# Patient Record
Sex: Female | Born: 1960 | Race: White | Hispanic: No | Marital: Married | Smoking: Former smoker
Health system: Southern US, Community
[De-identification: ages and names within clinical notes are randomized; demographics above are authoritative.]

## PROBLEM LIST (undated history)

## (undated) DIAGNOSIS — R519 Headache, unspecified: Secondary | ICD-10-CM

## (undated) DIAGNOSIS — K279 Peptic ulcer, site unspecified, unspecified as acute or chronic, without hemorrhage or perforation: Secondary | ICD-10-CM

## (undated) DIAGNOSIS — K219 Gastro-esophageal reflux disease without esophagitis: Secondary | ICD-10-CM

## (undated) DIAGNOSIS — S0990XA Unspecified injury of head, initial encounter: Secondary | ICD-10-CM

## (undated) DIAGNOSIS — I10 Essential (primary) hypertension: Secondary | ICD-10-CM

## (undated) DIAGNOSIS — N921 Excessive and frequent menstruation with irregular cycle: Secondary | ICD-10-CM

## (undated) DIAGNOSIS — K5900 Constipation, unspecified: Secondary | ICD-10-CM

## (undated) DIAGNOSIS — Z87891 Personal history of nicotine dependence: Secondary | ICD-10-CM

## (undated) DIAGNOSIS — T1490XA Injury, unspecified, initial encounter: Secondary | ICD-10-CM

## (undated) DIAGNOSIS — D649 Anemia, unspecified: Secondary | ICD-10-CM

## (undated) DIAGNOSIS — M1990 Unspecified osteoarthritis, unspecified site: Secondary | ICD-10-CM

## (undated) HISTORY — DX: Essential (primary) hypertension: I10

## (undated) HISTORY — DX: Constipation, unspecified: K59.00

## (undated) HISTORY — PX: NECK SURGERY: SHX720

## (undated) HISTORY — DX: Unspecified injury of head, initial encounter: S09.90XA

## (undated) HISTORY — DX: Peptic ulcer, site unspecified, unspecified as acute or chronic, without hemorrhage or perforation: K27.9

## (undated) HISTORY — DX: Personal history of nicotine dependence: Z87.891

## (undated) HISTORY — PX: BRAIN SURGERY: SHX531

## (undated) HISTORY — DX: Anemia, unspecified: D64.9

## (undated) HISTORY — DX: Gastro-esophageal reflux disease without esophagitis: K21.9

## (undated) HISTORY — DX: Injury, unspecified, initial encounter: T14.90XA

## (undated) HISTORY — DX: Excessive and frequent menstruation with irregular cycle: N92.1

---

## 1994-03-10 DIAGNOSIS — S0990XA Unspecified injury of head, initial encounter: Secondary | ICD-10-CM

## 1994-03-10 HISTORY — PX: OTHER SURGICAL HISTORY: SHX169

## 1994-03-10 HISTORY — DX: Unspecified injury of head, initial encounter: S09.90XA

## 2003-03-21 ENCOUNTER — Emergency Department (HOSPITAL_COMMUNITY): Admission: AD | Admit: 2003-03-21 | Discharge: 2003-03-21 | Payer: Self-pay | Admitting: Emergency Medicine

## 2003-05-15 ENCOUNTER — Encounter: Admission: RE | Admit: 2003-05-15 | Discharge: 2003-07-07 | Payer: Self-pay | Admitting: Sports Medicine

## 2003-06-24 ENCOUNTER — Encounter: Admission: RE | Admit: 2003-06-24 | Discharge: 2003-06-24 | Payer: Self-pay | Admitting: Sports Medicine

## 2004-10-30 ENCOUNTER — Ambulatory Visit: Payer: Self-pay | Admitting: Internal Medicine

## 2004-11-13 ENCOUNTER — Ambulatory Visit: Payer: Self-pay | Admitting: Internal Medicine

## 2004-11-13 LAB — CONVERTED CEMR LAB: Pap Smear: NORMAL

## 2005-12-17 ENCOUNTER — Emergency Department (HOSPITAL_COMMUNITY): Admission: EM | Admit: 2005-12-17 | Discharge: 2005-12-17 | Payer: Self-pay | Admitting: Emergency Medicine

## 2006-01-23 ENCOUNTER — Encounter (INDEPENDENT_AMBULATORY_CARE_PROVIDER_SITE_OTHER): Payer: Self-pay | Admitting: Internal Medicine

## 2006-01-23 DIAGNOSIS — Z87828 Personal history of other (healed) physical injury and trauma: Secondary | ICD-10-CM | POA: Insufficient documentation

## 2006-01-23 DIAGNOSIS — Z87891 Personal history of nicotine dependence: Secondary | ICD-10-CM

## 2006-01-23 DIAGNOSIS — K219 Gastro-esophageal reflux disease without esophagitis: Secondary | ICD-10-CM | POA: Insufficient documentation

## 2006-01-23 DIAGNOSIS — N921 Excessive and frequent menstruation with irregular cycle: Secondary | ICD-10-CM | POA: Insufficient documentation

## 2006-01-23 DIAGNOSIS — N920 Excessive and frequent menstruation with regular cycle: Secondary | ICD-10-CM

## 2006-02-06 ENCOUNTER — Inpatient Hospital Stay (HOSPITAL_COMMUNITY): Admission: RE | Admit: 2006-02-06 | Discharge: 2006-02-07 | Payer: Self-pay | Admitting: Neurosurgery

## 2006-02-20 DIAGNOSIS — D509 Iron deficiency anemia, unspecified: Secondary | ICD-10-CM | POA: Insufficient documentation

## 2009-02-07 ENCOUNTER — Ambulatory Visit (HOSPITAL_COMMUNITY): Admission: RE | Admit: 2009-02-07 | Discharge: 2009-02-07 | Payer: Self-pay | Admitting: Neurosurgery

## 2009-03-13 ENCOUNTER — Ambulatory Visit (HOSPITAL_COMMUNITY): Admission: RE | Admit: 2009-03-13 | Discharge: 2009-03-14 | Payer: Self-pay | Admitting: Neurosurgery

## 2009-03-22 ENCOUNTER — Emergency Department (HOSPITAL_COMMUNITY): Admission: EM | Admit: 2009-03-22 | Discharge: 2009-03-22 | Payer: Self-pay | Admitting: Emergency Medicine

## 2009-04-12 ENCOUNTER — Encounter (HOSPITAL_COMMUNITY): Admission: RE | Admit: 2009-04-12 | Discharge: 2009-05-12 | Payer: Self-pay | Admitting: Neurosurgery

## 2009-05-14 ENCOUNTER — Encounter (HOSPITAL_COMMUNITY): Admission: RE | Admit: 2009-05-14 | Discharge: 2009-06-13 | Payer: Self-pay | Admitting: Neurosurgery

## 2010-01-22 ENCOUNTER — Encounter (HOSPITAL_COMMUNITY)
Admission: RE | Admit: 2010-01-22 | Discharge: 2010-02-21 | Payer: Self-pay | Source: Home / Self Care | Attending: Neurosurgery | Admitting: Neurosurgery

## 2010-02-27 ENCOUNTER — Encounter (HOSPITAL_COMMUNITY)
Admission: RE | Admit: 2010-02-27 | Discharge: 2010-03-29 | Payer: Self-pay | Source: Home / Self Care | Attending: Neurosurgery | Admitting: Neurosurgery

## 2010-03-31 ENCOUNTER — Encounter: Payer: Self-pay | Admitting: General Surgery

## 2010-05-24 ENCOUNTER — Emergency Department (HOSPITAL_COMMUNITY)
Admission: EM | Admit: 2010-05-24 | Discharge: 2010-05-25 | Discharge status: Against Medical Advice | Payer: PRIVATE HEALTH INSURANCE | Attending: Emergency Medicine | Admitting: Emergency Medicine

## 2010-05-24 ENCOUNTER — Emergency Department (HOSPITAL_COMMUNITY): Payer: PRIVATE HEALTH INSURANCE

## 2010-05-24 DIAGNOSIS — R079 Chest pain, unspecified: Secondary | ICD-10-CM | POA: Insufficient documentation

## 2010-05-24 DIAGNOSIS — R55 Syncope and collapse: Secondary | ICD-10-CM | POA: Insufficient documentation

## 2010-05-24 DIAGNOSIS — F411 Generalized anxiety disorder: Secondary | ICD-10-CM | POA: Insufficient documentation

## 2010-05-24 DIAGNOSIS — I10 Essential (primary) hypertension: Secondary | ICD-10-CM | POA: Insufficient documentation

## 2010-05-24 LAB — BASIC METABOLIC PANEL
BUN: 7 mg/dL (ref 6–23)
CO2: 26 mEq/L (ref 19–32)
Calcium: 9.7 mg/dL (ref 8.4–10.5)
Chloride: 104 mEq/L (ref 96–112)
GFR calc Af Amer: >60 mL/min (ref >60)
Glucose, Bld: 89 mg/dL (ref 70–99)
Potassium: 3.4 mEq/L — ABNORMAL LOW (ref 3.5–5.1)
Sodium: 140 mEq/L (ref 135–145)

## 2010-05-24 LAB — CBC
HCT: 38 % (ref 36.0–46.0)
Hemoglobin: 12.7 g/dL (ref 12.0–15.0)
MCH: 30.2 pg (ref 26.0–34.0)
MCV: 90.3 fL (ref 78.0–100.0)
Platelets: 175 10*3/uL (ref 150–400)
RBC: 4.21 MIL/uL (ref 3.87–5.11)
RDW: 12.4 % (ref 11.5–15.5)
WBC: 5.1 10*3/uL (ref 4.0–10.5)

## 2010-05-24 LAB — DIFFERENTIAL
Basophils Absolute: 0 10*3/uL (ref 0.0–0.1)
Eosinophils Absolute: 0.1 10*3/uL (ref 0.0–0.7)
Eosinophils Relative: 2 % (ref 0–5)
Lymphocytes Relative: 44 % (ref 12–46)
Lymphs Abs: 2.3 10*3/uL (ref 0.7–4.0)
Monocytes Absolute: 0.4 10*3/uL (ref 0.1–1.0)
Neutro Abs: 2.3 10*3/uL (ref 1.7–7.7)

## 2010-05-28 LAB — POCT CARDIAC MARKERS
CKMB, poc: <1 ng/mL — ABNORMAL LOW (ref 1.0–8.0)
Troponin i, poc: <0.05 ng/mL (ref 0.00–0.09)

## 2010-05-29 ENCOUNTER — Encounter: Payer: Self-pay | Admitting: Cardiology

## 2010-05-29 ENCOUNTER — Ambulatory Visit (INDEPENDENT_AMBULATORY_CARE_PROVIDER_SITE_OTHER): Payer: PRIVATE HEALTH INSURANCE | Admitting: Cardiology

## 2010-05-29 ENCOUNTER — Encounter: Payer: PRIVATE HEALTH INSURANCE | Admitting: Cardiology

## 2010-05-29 VITALS — BP 134/97 | HR 67 | Ht 65.0 in | Wt 123.0 lb

## 2010-05-29 DIAGNOSIS — R55 Syncope and collapse: Secondary | ICD-10-CM | POA: Insufficient documentation

## 2010-05-29 NOTE — Progress Notes (Signed)
HPI:  Initial visit at the kind request of the [facility] emergency department for evaluation of syncope.  Patient has enjoyed generally excellent health except for a severe head injury secondary to trauma in 1996.  She has had no hypertension, diabetes or hyperlipidemia.  Use of tobacco products was modest and discontinued 3 years ago.  Lifestyle is relatively sedentary.  Approximately 2 weeks ago, while visiting a sister in the hospital following elective surgery, patient felt warm, noted darkening of her vision and impaired interpretation of acoustic events.  She attempted to move across the room, but subsequently awakened on the floor.  She was not injured and was assisted in her fall by a bystander.  There have been no recent illnesses or other symptoms.  She did well until approximately one week ago when she suffered a recurrence but did not entirely lose consciousness.  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  She was evaluated in the emergency department where no abnormal findings were identified.  She denies exertional dyspnea, prior episodes of syncope, pedal edema, orthopnea or PND.  Current Outpatient Prescriptions on File Prior to Visit  Medication Sig Dispense Refill  . lansoprazole (PREVACID) 30 MG capsule Take 30 mg by mouth daily.          No known allergies.    Past Medical History  Diagnosis Date  . GERD (gastroesophageal reflux disease)   . History of tobacco abuse      15 pack years; discontinued 2008  . Head trauma     depressed skull fracture and epidural hematoma following below with a blunt object; surgery and prolonged rehabilitation required  . Anemia     presumed related to metromenorrhagia  . Menometrorrhagia      Past Surgical History  Procedure Date  . Crainiotomy 1996  . Neck surgery     x2     Family History  Problem Relation Age of Onset  . Coronary artery disease Mother     CABG  . Coronary artery disease Brother    CABG-2011  . Coronary artery disease Sister     PCI     History   Social History  . Marital Status: Divorced    Spouse Name: N/A    Number of Children: N/A  . Years of Education: N/A   Occupational History  . 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 Smoking status: Former Smoker -- 0.5 packs/day for 25 years    Types: Cigarettes    Quit date: 05/29/2007  . Smokeless tobacco: Never Used  . Alcohol Use: No  . Drug Use: No  . Sexually Active: Not on file   Other Topics Concern  . Not on file   Social History Narrative  . No narrative on file     ROS:           All other systems reviewed and are negative.  PHYSICAL EXAM: BP 134/97  Pulse 67  Ht 5\' 5"  (1.651 m)  Wt 123 lb (55.792 kg)  BMI 20.47 kg/m2  SpO2 93% ; no orthostatic change in blood pressure General-Well-developed; no acute distress; proportionate weight and height HEENT-/AT; PERRL; EOM intact; conjunctiva and lids nl Neck-No JVD; no carotid bruits Endocrine-No thyromegaly Lungs-No tachypnea, clear without rales, rhonchi or wheezes Cardiovascular- normal PMI; normal S1 and S2 Abdomen-BS normal; soft and non-tender without masses or organomegaly Musculoskeletal-No deformities, cyanosis or clubbing Neurologic-Nl  cranial nerves; symmetric strength and tone Skin- Warm, no sig. Lesions Extremities-Nl distal pulses; no edema  EKG:   Sinus bradycardia at a rate of 59; borderline first degree AV block; borderline left atrial abnormality; delayed R-wave progression; minor nonspecific ST-T wave abnormality; no previous tracing for comparison.  ASSESSMENT AND PLAN:

## 2010-05-29 NOTE — Patient Instructions (Signed)
Tests to be performed : Echocardiogram and 21 day event recorder for your heart

## 2010-05-29 NOTE — Assessment & Plan Note (Signed)
Ms. Kundert presents with 2 episodes suggestive of cerebral hypoperfusion without apparent cause.  Statistically, the most likely etiology would be neurocardiogenic.  Records of emergency department evaluation obtained and reviewed.  There is no evidence for a significant orthostatic change in blood pressure, for myocardial injury or infarction or any other serious condition.  A CBC and metabolic profile were normal.  Chest x-ray was normal.  Cardiac markers were negative.  Arrhythmia certainly within the differential for this patient spells.  She will be provided with an event recorder for the next month.  An echocardiogram will be obtained.  I will evaluate this nice woman after those studies have been completed.

## 2010-05-31 ENCOUNTER — Other Ambulatory Visit: Payer: Self-pay | Admitting: Cardiology

## 2010-05-31 ENCOUNTER — Ambulatory Visit (HOSPITAL_COMMUNITY)
Admission: RE | Admit: 2010-05-31 | Discharge: 2010-05-31 | Disposition: A | Discharge status: Home / Self Care | Payer: PRIVATE HEALTH INSURANCE | Source: Ambulatory Visit | Attending: Cardiology | Admitting: Cardiology

## 2010-05-31 DIAGNOSIS — I359 Nonrheumatic aortic valve disorder, unspecified: Secondary | ICD-10-CM

## 2010-05-31 DIAGNOSIS — R55 Syncope and collapse: Secondary | ICD-10-CM | POA: Insufficient documentation

## 2010-05-31 DIAGNOSIS — I10 Essential (primary) hypertension: Secondary | ICD-10-CM | POA: Insufficient documentation

## 2010-05-31 DIAGNOSIS — Z8249 Family history of ischemic heart disease and other diseases of the circulatory system: Secondary | ICD-10-CM | POA: Insufficient documentation

## 2010-06-04 ENCOUNTER — Encounter: Payer: Self-pay | Admitting: Cardiology

## 2010-06-10 LAB — CBC
HCT: 37.9 % (ref 36.0–46.0)
Hemoglobin: 13 g/dL (ref 12.0–15.0)
MCHC: 34.3 g/dL (ref 30.0–36.0)
MCV: 92.7 fL (ref 78.0–100.0)
Platelets: 188 10*3/uL (ref 150–400)
RBC: 4.09 MIL/uL (ref 3.87–5.11)
RDW: 12.4 % (ref 11.5–15.5)
WBC: 6.4 10*3/uL (ref 4.0–10.5)

## 2010-06-10 LAB — BASIC METABOLIC PANEL
BUN: 10 mg/dL (ref 6–23)
CO2: 28 mEq/L (ref 19–32)
Calcium: 9.6 mg/dL (ref 8.4–10.5)
Chloride: 102 mEq/L (ref 96–112)
Creatinine, Ser: 0.61 mg/dL (ref 0.4–1.2)
GFR calc Af Amer: >60 mL/min (ref >60)
GFR calc non Af Amer: >60 mL/min (ref >60)
Glucose, Bld: 95 mg/dL (ref 70–99)
Potassium: 3.7 mEq/L (ref 3.5–5.1)
Sodium: 138 mEq/L (ref 135–145)

## 2010-07-17 ENCOUNTER — Encounter: Payer: Self-pay | Admitting: Cardiology

## 2010-07-17 ENCOUNTER — Ambulatory Visit (INDEPENDENT_AMBULATORY_CARE_PROVIDER_SITE_OTHER): Payer: PRIVATE HEALTH INSURANCE | Admitting: Cardiology

## 2010-07-17 VITALS — BP 138/83 | HR 57 | Ht 65.0 in | Wt 121.0 lb

## 2010-07-17 DIAGNOSIS — R55 Syncope and collapse: Secondary | ICD-10-CM

## 2010-07-17 NOTE — Assessment & Plan Note (Signed)
Symptoms have resolved spontaneously.  Event recording revealed no arrhythmias.  Echocardiogram was completely normal except for slight septal hypertrophy.  At this point, we have no explanation for syncope, but no continuing symptoms either.  No further evaluation is warranted unless she develops additional spells.  She was cautioned to maintain good fluid intake and salt intake.  She will call if she has any additional problems.

## 2010-07-17 NOTE — Patient Instructions (Signed)
Your physician recommends that you schedule a follow-up appointment in: AS NEEDED  

## 2010-07-17 NOTE — Progress Notes (Signed)
HPI : Cheyenne Brown returns to the office as scheduled for continued assessment and treatment of syncope and near syncope.  Since her last visit, she has done generally well.  Event recording revealed no arrhythmias, but Cheyenne Brown reported multiple symptomatic spells including chest discomfort, palpitations and lightheadedness.  In retrospect, she believes that she was hypervigilant with respect to her internal state because she was carrying the monitor and that nearly all of the symptoms she reported were trivial.  She has had no recurrent significant lightheadedness and certainly no loss of consciousness or falls.  Current Outpatient Prescriptions on File Prior to Visit  Medication Sig Dispense Refill  . atenolol (TENORMIN) 25 MG tablet Take 25 mg by mouth daily. 1/2 tab daily per DR.Wilson [facility]       . DISCONTD: lansoprazole (PREVACID) 30 MG capsule Take 30 mg by mouth daily.           No Known Allergies    Past medical history, social history, and family history reviewed and updated.  ROS: See history of present illness  PHYSICAL EXAM: BP 138/83  Pulse 57  Ht 5\' 5"  (1.651 m)  Wt 121 lb (54.885 kg)  BMI 20.14 kg/m2  SpO2 93%  General-Well developed; no acute distress Body habitus-proportionate weight and height Neck-No JVD; no carotid bruits Lungs-clear lung fields; resonant to percussion Cardiovascular-normal PMI; normal S1 slightly accentuated S2 Abdomen-normal bowel sounds; soft and non-tender without masses or organomegaly Musculoskeletal-No deformities, no cyanosis or clubbing Neurologic-Normal cranial nerves; symmetric strength and tone Skin-Warm, moderately tanned; no significant lesions Extremities-distal pulses intact; no edema  ASSESSMENT AND PLAN:

## 2010-07-26 NOTE — Op Note (Signed)
Cheyenne Brown, Cheyenne Brown             ACCOUNT NO.:  [id number]   MEDICAL RECORD NO.:  [id number]          PATIENT TYPE:  INP   LOCATION:  3020                         FACILITY:  [facility]   PHYSICIAN:  Danae Orleans. Venetia Maxon, M.D.  DATE OF BIRTH:  21-Jun-1960   DATE OF PROCEDURE:  02/06/2006  DATE OF DISCHARGE:  02/07/2006                               OPERATIVE REPORT   PREOPERATIVE DIAGNOSIS:  Herniated cervical disk with spondylosis,  degenerative disk disease, and radiculopathy C4-5 and C5-6 levels.   POSTOPERATIVE DIAGNOSIS:  Herniated cervical disk with spondylosis,  degenerative disk disease, and radiculopathy C4-5 and C5-6 levels.   PROCEDURE:  Anterior cervical decompression and fusion C4-5 and C5-6  with PEEK interbody cages, Morse's bone autograft, Osteocel, and  anterior cervical plate.   SURGEON:  Venetia Maxon.   ASSISTANT:  1. Poteat, R.N.  Nira Retort, M.D.   ANESTHESIA:  General endotracheal anesthesia.   ESTIMATED BLOOD LOSS:  Minimal.   COMPLICATIONS:  None.   DISPOSITION:  Recovery.   INDICATIONS:  Cheyenne Brown is a 50 year old woman with severe right  arm pain.  It was elected to take her to surgery for anterior cervical  decompression and fusion at the C4-5 and C5-6 levels for disk  herniations at each of these levels.   PROCEDURE:  Ms. Ellington was brought to the operating room.  Following  satisfactory and uncomplicated induction of general endotracheal  anesthesia and placement of intravenous lines, the patient was placed in  the supine position on the operating table.  Her neck was placed in  neutral alignment.  Her  anterior neck was then prepped and draped in  the usual sterile fashion.  Area of planned incision was infiltrated  with 0.25% Marcaine, 0.5% lidocaine and 1:200,000 epinephrine.  An  incision was made from midline to the anterior border of  sternocleidomastoid muscle, carried through platysmal layer.  Subplatysmal dissection was performed exposing  the anterior border of  sternocleidomastoid muscle.  Using blunt dissection, the carotid sheath  was kept lateral, the trachea and esophagus kept medial, exposing the  anterior cervical spine.  A bent spinal needle was placed, it was felt  to be at the C5-6 level and this was confirmed on intraoperative x-ray.  Subsequently, the longus coli muscles were taken down from the anterior  cervical spine from C4-C6 levels using electrocautery, Key elevator and  __________ self-retaining retractor along with up-and-down retractors  were placed to facilitate exposure.  The interspaces of C4-5 and C5-6  were then incised and disk material was removed in a piecemeal fashion.  The endplates were stripped of residual disk material also in the  lateral recesses.  Subsequently, a high speed drill was used and bone  drilled from the endplates was retained for later use for bone grafting,  this is done at both of these levels.  The posterior longitudinal  ligament was incised and removed in a piecemeal fashion and lateral  foraminal disk herniation was removed at the C5-6 level on the right  with significant right C6 nerve root compression, and thecal sac and  spinal cord dura of both C6  nerve roots were well decompressed.  After  trial sizing, a 6 mm PEEK interbody cage was selected, packed with  morcellized bone autograft and supplemented with Osteocel.  This was  inserted in the interspace and countersunk appropriately at the  conclusion of both level diskectomies being performed.  The C4-5 level  was similarly decompressed and the C5 nerve root was decompressed to  extend it at the neural foramen on the right.  Hemostasis was assured  with Gelfoam soaked in thrombin.  The endplates were stripped of  residual disk material and the central spinal cord dura and neural  foramen were decompressed.  A similarly sized 6 mm medium Alphatec PEEK  cage was selected, packed with morcellized bone autograft and  Osteocel  and this was inserted at this interspace as well.  Traction weight was  removed.  A 32 mm Trestle anterior cervical plate was then affixed to  the anterior cervical spine using variable angle 14 mm screws, two at  C4, two at C5, and two at C6.  All screws had excellent purchase and  locking mechanisms were engaged.  Final x-ray demonstrated well-  positioned interbody grafts and anterior cervical plate.  Hemostasis was  then assured and soft tissues were inspected and found to be in good  repair and the platysma layer was closed with #3-0 Vicryl sutures, the  skin edges were approximated with #3-0 Vicryl interrupted inverted  sutures, the wound was dressed with Dermabond.  The patient was  extubated in the operating room, taken to the recovery room in stable  satisfactory condition, having tolerated the operation well.  Counts  correct at the end of the case.      Danae Orleans. Venetia Maxon, M.D.  Electronically Signed     JDS/MEDQ  D:  02/06/2006  T:  02/08/2006  Job:  [id number]

## 2010-10-16 ENCOUNTER — Encounter: Payer: Self-pay | Admitting: Adult Health

## 2010-10-16 ENCOUNTER — Ambulatory Visit (INDEPENDENT_AMBULATORY_CARE_PROVIDER_SITE_OTHER): Payer: PRIVATE HEALTH INSURANCE | Admitting: Adult Health

## 2010-10-16 DIAGNOSIS — R0789 Other chest pain: Secondary | ICD-10-CM | POA: Insufficient documentation

## 2010-10-16 DIAGNOSIS — R079 Chest pain, unspecified: Secondary | ICD-10-CM

## 2010-10-16 NOTE — Progress Notes (Signed)
HPI:  Cheyenne Brown is a 50 y/o female patient of Dr. Dietrich Pates who he has done multiple testing on to evaluate for chest pain and palpitations.  Most recently he has completed a cardionet monitor and echocardiogram., They have been normal. She comes today for complaints of epigastric chest pain. She states her primary care physician has referred her to GI for EGD but she has not gone yet until seen by cardiology at her request to make sure it is ok. She complaints of fullness in the midgastrium with trouble swallowing sometimes and need to burp.  She states it feels better when she lies down.  She continues to drink a lot of caffeine.    No Known Allergies  Current Outpatient Prescriptions  Medication Sig Dispense Refill  . ALPRAZolam (XANAX) 1 MG tablet Take 1 mg by mouth 2 (two) times daily.        Marland Kitchen atenolol (TENORMIN) 25 MG tablet Take 25 mg by mouth daily. 1/2 tab daily per DR.Wilson [facility]       . citalopram (CELEXA) 20 MG tablet Take 20 mg by mouth daily.          Past Medical History  Diagnosis Date  . GERD (gastroesophageal reflux disease)   . History of tobacco abuse      15 pack years; discontinued 2008  . Head trauma     depressed skull fracture and epidural hematoma following below with a blunt object; surgery and prolonged rehabilitation required  . Anemia     presumed related to metromenorrhagia  . Menometrorrhagia     Past Surgical History  Procedure Date  . Crainiotomy 1996  . Neck surgery     x2    XBJ:YNWGNF of systems complete and found to be negative unless listed above PHYSICAL EXAM BP 128/84  Pulse 64  Ht 5\' 5"  (1.651 m)  Wt 125 lb (56.7 kg)  BMI 20.80 kg/m2  SpO2 99% General: Well developed, well nourished, in no acute distress Head: Eyes PERRLA, No xanthomas.   Normal cephalic and atramatic  Lungs: Clear bilaterally to auscultation and percussion. Heart: HRRR S1 S2, r.  Pulses are 2+ & equal.            No carotid  bruit. No JVD.  No abdominal bruits. No femoral bruits. Abdomen: Bowel sounds are positive, abdomen soft and non-tender without masses or                  Hernia's noted. Msk:  Back normal, normal gait. Normal strength and tone for age. Extremities: No clubbing, cyanosis or edema.  DP +1 Neuro: Alert and oriented X 3. Psych:  Good affect, responds appropriately  AOZ:HYQMV bradycardia with rate of 59 bpm.  ASSESSMENT AND PLAN

## 2010-10-16 NOTE — Assessment & Plan Note (Signed)
I do not believe her symptoms to be related to cardiac etiology at this time. They appear to be more GI in origin.  She is to proceed with EGD for further evaluation secondary to dysphagia and tightness when she eats.  If this is normal with no need for intervention, will proceed with stress test for further evaluation.  She has a history in her family of heart disease.  Other testing by Dr. Dietrich Pates is negative. She is to return prn.

## 2010-10-16 NOTE — Patient Instructions (Signed)
Your physician recommends that you schedule a follow-up appointment in: AS NEEDED  Your physician recommends that you continue on your current medications as directed. Please refer to the Current Medication list given to you today.  

## 2010-10-23 ENCOUNTER — Ambulatory Visit (INDEPENDENT_AMBULATORY_CARE_PROVIDER_SITE_OTHER): Payer: PRIVATE HEALTH INSURANCE | Admitting: Gastroenterology

## 2010-10-23 ENCOUNTER — Encounter: Payer: Self-pay | Admitting: Gastroenterology

## 2010-10-23 DIAGNOSIS — R1310 Dysphagia, unspecified: Secondary | ICD-10-CM

## 2010-10-23 DIAGNOSIS — R0789 Other chest pain: Secondary | ICD-10-CM

## 2010-10-23 DIAGNOSIS — Z1211 Encounter for screening for malignant neoplasm of colon: Secondary | ICD-10-CM

## 2010-10-23 MED ORDER — DEXLANSOPRAZOLE 60 MG PO CPDR: 60.0000 mg | DELAYED_RELEASE_CAPSULE | Freq: Every day | ORAL | Status: AC

## 2010-10-23 MED ORDER — PEG-KCL-NACL-NASULF-NA ASC-C 100 G PO SOLR: 1.0000 | Freq: Once | ORAL | Status: DC

## 2010-10-23 NOTE — Patient Instructions (Signed)
We have set you up for a colonoscopy and endoscopy to check your upper and lower GI tract.  Start taking Dexilant 60 mg daily each morning. Samples have been provided.  Further recommendations after procedures are completed.

## 2010-10-23 NOTE — Progress Notes (Signed)
Referring Provider: Barbie Banner., MD Primary Care Physician:  Pamelia Hoit, MD Primary Gastroenterologist:  Dr. Jena Gauss   Chief Complaint  Patient presents with  . Dysphagia    has had neck surgery    HPI:   Cheyenne Brown is a pleasant 50 year old Caucasian female who presents today due to dysphagia for approximately one year and atypical chest pain, which has been evaluated thoroughly by cardiac. She has a remote hx of reported PUD, diagnosed by EGD at [facility] many years ago. These reports have been requested. She reports prolonged use of BC powders at that time but only uses sparingly now (every 6-7 months). Reports dysphagia X 1 year, since neck surgery. Difficulty with pills, liquids. Occasional nausea. Chest discomfort intermittently, X 3 weeks, described as a "tight muscle" sensation through to back. Not worsened with eating. Relieved when laying down. No hematemesis. No lack of appetite or wt loss.   No black tarry stools. No NSAIDs, rare Goodys (every 6-7 months).  No brbpr. No prior colonoscopy. No change in bowel habits. Miralax prn, stool softener twice per week.    Past Medical History  Diagnosis Date  . GERD (gastroesophageal reflux disease)   . History of tobacco abuse      15 pack years; discontinued 2008  . Head trauma 1996    depressed skull fracture and epidural hematoma following below with a blunt object; surgery and prolonged rehabilitation required  . Anemia     presumed related to metromenorrhagia  . Menometrorrhagia   . PUD (peptic ulcer disease)     per pt report, likely secondary to [facility] powders    Past Surgical History  Procedure Date  . Crainiotomy 1996  . Neck surgery     x2    Current Outpatient Prescriptions  Medication Sig Dispense Refill  . ALPRAZolam (XANAX) 1 MG tablet Take 1 mg by mouth 2 (two) times daily.        Marland Kitchen atenolol (TENORMIN) 25 MG tablet Take 25 mg by mouth daily. 1/2 tab daily per DR.Wilson [facility]       . citalopram (CELEXA) 20 MG tablet Take 20 mg by mouth daily.        Marland Kitchen dexlansoprazole (DEXILANT) 60 MG capsule Take 1 capsule (60 mg total) by mouth daily.  15 capsule  0  . peg 3350 powder (MOVIPREP) 100 G SOLR Take 1 kit (100 g total) by mouth once. As directed Please purchase 1 Fleets enema to use with the prep  1 kit  0    Allergies as of 10/23/2010  . (No Known Allergies)    Family History  Problem Relation Age of Onset  . Coronary artery disease Mother     CABG  . Coronary artery disease Brother     CABG-2011  . Coronary artery disease Sister     PCI  . Colon cancer Neg Hx   . Ulcers Father     History   Social History  . Marital Status: Married    Spouse Name: N/A    Number of Children: 2  . Years of Education: N/A   Occupational History  . 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 Smoking status: Former Smoker -- 0.5 packs/day for 25 years    Types: Cigarettes    Quit date: 05/29/2007  . Smokeless tobacco: Never Used  . Alcohol Use: No  . Drug Use: No  .  Sexually Active: Not on file    Review of Systems: Gen: Denies any fever, chills, loss of appetite, fatigue, weight loss. CV: Denies heart palpitations, syncope, peripheral edema. Resp: Denies shortness of breath with rest, cough, wheezing GI:+ dysphagia. Denies hematemesis, fecal incontinence, or jaundice.  GU : Denies urinary burning, urinary frequency, urinary incontinence.  MS: Denies joint pain, muscle weakness, cramps, limited movement Derm: Denies rash, itching, dry skin Psych: Denies depression, anxiety, confusion or memory loss  Heme: Denies bruising, bleeding, and enlarged lymph nodes.  Physical Exam: BP 121/85  Pulse 60  Temp(Src) 97.3 F (36.3 C) (Temporal)  Ht 5\' 5"  (1.651 m)  Wt 123 lb 3.2 oz (55.883 kg)  BMI 20.50 kg/m2 General:   Alert and oriented. Well-developed, well-nourished, pleasant and cooperative. Head:   Normocephalic and atraumatic. Eyes:  Conjunctiva pink, sclera clear, no icterus.   Conjunctiva pink. Ears:  Normal auditory acuity. Nose:  No deformity, discharge,  or lesions. Mouth:  No deformity or lesions, mucosa pink and moist.  Neck:  Supple, without mass or thyromegaly. Lungs:  Clear to auscultation bilaterally, without wheezing, rales, or rhonchi.  Heart:  S1, S2 present without murmurs noted.  Abdomen:  +BS, soft, non-tender and non-distended. Without mass or HSM. No rebound or guarding. No hernias noted. Rectal:  Deferred  Msk:  Symmetrical without gross deformities. Normal posture. Extremities:  Without clubbing or edema. Neurologic:  Alert and  oriented x4;  grossly normal neurologically. Skin:  Intact, warm and dry without significant lesions or rashes Cervical Nodes:  No significant cervical adenopathy. Psych:  Alert and cooperative. Normal mood and affect.

## 2010-10-24 ENCOUNTER — Encounter: Payer: Self-pay | Admitting: Gastroenterology

## 2010-10-24 DIAGNOSIS — R1310 Dysphagia, unspecified: Secondary | ICD-10-CM | POA: Insufficient documentation

## 2010-10-24 DIAGNOSIS — Z1211 Encounter for screening for malignant neoplasm of colon: Secondary | ICD-10-CM | POA: Insufficient documentation

## 2010-10-24 NOTE — Assessment & Plan Note (Signed)
No prior colonoscopy. Due for routine screening. No alarm features currently.  Proceed with TCS with Dr. Jena Gauss in near future: the risks, benefits, and alternatives have been discussed with the patient in detail. The patient states understanding and desires to proceed.

## 2010-10-24 NOTE — Assessment & Plan Note (Signed)
50 year old female with dysphagia X 1 year, onset around time of neck surgery via anterior approach. Atypical chest discomfort as well for past 3 weeks, with thorough cardiac work-up benign. Described as "tight muscle", through to back. Not exacerbated by eating/drinking. Relieved when laying down. Denies tarry stools, does have reported hx of PUD in remote past (EGD at [facility]) in the setting of prolonged BC powder use. Currently denies routine NSAIDs, aspirin powders. Needs further evaluation with EGD.  Proceed with upper endoscopy and possible dilation in the near future with Dr. Jena Gauss. The risks, benefits, and alternatives have been discussed in detail with patient. They have stated understanding and desire to proceed.  Start Dexilant 60 mg daily (samples and rx provided) Avoid NSAIDs, aspirin powders Obtain op note from last EGD in remote past

## 2010-10-25 NOTE — Progress Notes (Signed)
Cc to PCP 

## 2010-10-29 MED ORDER — SODIUM CHLORIDE 0.45 % IV SOLN
Freq: Once | INTRAVENOUS | Status: AC
  Administered 2010-10-30: 11:00:00 via INTRAVENOUS

## 2010-10-30 ENCOUNTER — Encounter (HOSPITAL_COMMUNITY): Payer: Self-pay

## 2010-10-30 ENCOUNTER — Ambulatory Visit (HOSPITAL_COMMUNITY)
Admission: RE | Admit: 2010-10-30 | Discharge: 2010-10-30 | Disposition: A | Discharge status: Home / Self Care | Payer: PRIVATE HEALTH INSURANCE | Source: Ambulatory Visit | Attending: Internal Medicine | Admitting: Internal Medicine

## 2010-10-30 ENCOUNTER — Encounter (HOSPITAL_COMMUNITY)
Admission: RE | Disposition: A | Discharge status: Home / Self Care | Payer: Self-pay | Source: Ambulatory Visit | Attending: Internal Medicine

## 2010-10-30 DIAGNOSIS — R1319 Other dysphagia: Secondary | ICD-10-CM | POA: Insufficient documentation

## 2010-10-30 DIAGNOSIS — R1310 Dysphagia, unspecified: Secondary | ICD-10-CM

## 2010-10-30 DIAGNOSIS — K6389 Other specified diseases of intestine: Secondary | ICD-10-CM

## 2010-10-30 DIAGNOSIS — Z1211 Encounter for screening for malignant neoplasm of colon: Secondary | ICD-10-CM

## 2010-10-30 HISTORY — PX: MALONEY DILATION: SHX5535

## 2010-10-30 HISTORY — PX: COLONOSCOPY: SHX5424

## 2010-10-30 HISTORY — PX: ESOPHAGOGASTRODUODENOSCOPY: SHX5428

## 2010-10-30 SURGERY — COLONOSCOPY
Anesthesia: Moderate Sedation

## 2010-10-30 MED ORDER — MIDAZOLAM HCL 5 MG/5ML IJ SOLN
INTRAMUSCULAR | Status: AC
  Filled 2010-10-30: qty 10

## 2010-10-30 MED ORDER — MEPERIDINE HCL 100 MG/ML IJ SOLN
INTRAMUSCULAR | Status: AC
  Filled 2010-10-30: qty 2

## 2010-10-30 MED ORDER — MEPERIDINE HCL 100 MG/ML IJ SOLN
INTRAMUSCULAR | Status: DC | PRN
  Administered 2010-10-30: 25 mg via INTRAVENOUS
  Administered 2010-10-30: 50 mg via INTRAVENOUS
  Administered 2010-10-30: 25 mg via INTRAVENOUS

## 2010-10-30 MED ORDER — BUTAMBEN-TETRACAINE-BENZOCAINE 2-2-14 % EX AERO
INHALATION_SPRAY | CUTANEOUS | Status: DC | PRN
  Administered 2010-10-30: 2 via TOPICAL

## 2010-10-30 MED ORDER — SIMETHICONE 40 MG/0.6ML PO SUSP
ORAL | Status: DC | PRN
  Administered 2010-10-30: 12:00:00

## 2010-10-30 MED ORDER — MIDAZOLAM HCL 5 MG/5ML IJ SOLN
INTRAMUSCULAR | Status: DC | PRN
  Administered 2010-10-30: 1 mg via INTRAVENOUS
  Administered 2010-10-30: 2 mg via INTRAVENOUS
  Administered 2010-10-30 (×3): 1 mg via INTRAVENOUS

## 2010-10-30 NOTE — H&P (Signed)
Gerrit Halls, NP  10/24/2010 10:04 PM  Signed     Referring Provider: Barbie Banner., MD Primary Care Physician:  Pamelia Hoit, MD Primary Gastroenterologist:  Dr. Jena Gauss     Chief Complaint   Patient presents with   .  Dysphagia       has had neck surgery      HPI:    Ms. Hebdon is a pleasant 50 year old Caucasian female who presents today due to dysphagia for approximately one year and atypical chest pain, which has been evaluated thoroughly by cardiac. She has a remote hx of reported PUD, diagnosed by EGD at [facility] many years ago. These reports have been requested. She reports prolonged use of BC powders at that time but only uses sparingly now (every 6-7 months). Reports dysphagia X 1 year, since neck surgery. Difficulty with pills, liquids. Occasional nausea. Chest discomfort intermittently, X 3 weeks, described as a "tight muscle" sensation through to back. Not worsened with eating. Relieved when laying down. No hematemesis. No lack of appetite or wt loss.    No black tarry stools. No NSAIDs, rare Goodys (every 6-7 months).   No brbpr. No prior colonoscopy. No change in bowel habits. Miralax prn, stool softener twice per week.       Past Medical History   Diagnosis  Date   .  GERD (gastroesophageal reflux disease)     .  History of tobacco abuse          15 pack years; discontinued 2008   .  Head trauma  1996       depressed skull fracture and epidural hematoma following below with a blunt object; surgery and prolonged rehabilitation required   .  Anemia         presumed related to metromenorrhagia   .  Menometrorrhagia     .  PUD (peptic ulcer disease)         per pt report, likely secondary to [facility] powders       Past Surgical History   Procedure  Date   .  Crainiotomy  1996   .  Neck surgery         x2       Current Outpatient Prescriptions   Medication  Sig  Dispense  Refill   .  ALPRAZolam (XANAX) 1 MG tablet  Take 1 mg by mouth 2 (two) times daily.            Marland Kitchen  atenolol (TENORMIN) 25 MG tablet  Take 25 mg by mouth daily. 1/2 tab daily per DR.Wilson [facility]           .  citalopram (CELEXA) 20 MG tablet  Take 20 mg by mouth daily.           Marland Kitchen  dexlansoprazole (DEXILANT) 60 MG capsule  Take 1 capsule (60 mg total) by mouth daily.   15 capsule   0   .  peg 3350 powder (MOVIPREP) 100 G SOLR  Take 1 kit (100 g total) by mouth once. As directed  Please purchase 1 Fleets enema to use with the prep   1 kit   0       Allergies as of 10/23/2010   .  (No Known Allergies)       Family History   Problem  Relation  Age of Onset   .  Coronary artery disease  Mother         CABG   .  Coronary artery disease  Brother         CABG-2011   .  Coronary artery disease  Sister         PCI   .  Colon cancer  Neg Hx     .  Ulcers  Father         History       Social History   .  Marital Status:  Married       Spouse Name:  N/A       Number of Children:  2   .  Years of Education:  N/A       Occupational History   .  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  Smoking status:  Former Smoker -- 0.5 packs/day for 25 years       Types:  Cigarettes       Quit date:  05/29/2007   .  Smokeless tobacco:  Never Used   .  Alcohol Use:  No   .  Drug Use:  No   .  Sexually Active:  Not on file        Review of Systems: Gen: Denies any fever, chills, loss of appetite, fatigue, weight loss. CV: Denies heart palpitations, syncope, peripheral edema. Resp: Denies shortness of breath with rest, cough, wheezing GI:+ dysphagia. Denies hematemesis, fecal incontinence, or jaundice.   GU : Denies urinary burning, urinary frequency, urinary incontinence.   MS: Denies joint pain, muscle weakness, cramps, limited movement Derm: Denies rash, itching, dry skin Psych: Denies depression, anxiety, confusion or memory loss   Heme: Denies bruising, bleeding, and enlarged lymph  nodes.   Physical Exam: BP 121/85  Pulse 60  Temp(Src) 97.3 F (36.3 C) (Temporal)  Ht 5\' 5"  (1.651 m)  Wt 123 lb 3.2 oz (55.883 kg)  BMI 20.50 kg/m2 General:   Alert and oriented. Well-developed, well-nourished, pleasant and cooperative. Head:  Normocephalic and atraumatic. Eyes:  Conjunctiva pink, sclera clear, no icterus.   Conjunctiva pink. Ears:  Normal auditory acuity. Nose:  No deformity, discharge,  or lesions. Mouth:  No deformity or lesions, mucosa pink and moist.   Neck:  Supple, without mass or thyromegaly. Lungs:  Clear to auscultation bilaterally, without wheezing, rales, or rhonchi.   Heart:  S1, S2 present without murmurs noted.   Abdomen:  +BS, soft, non-tender and non-distended. Without mass or HSM. No rebound or guarding. No hernias noted. Rectal:  Deferred   Msk:  Symmetrical without gross deformities. Normal posture. Extremities:  Without clubbing or edema. Neurologic:  Alert and  oriented x4;  grossly normal neurologically. Skin:  Intact, warm and dry without significant lesions or rashes Cervical Nodes:  No significant cervical adenopathy. Psych:  Alert and cooperative. Normal mood and affect.       Glendora Score  10/25/2010  8:08 AM  Signed Cc to PCP        Dysphagia - Gerrit Halls, NP  10/24/2010 10:02 PM  Signed 50 year old female with dysphagia X 1 year, onset around time of neck surgery via anterior approach. Atypical chest discomfort as well for past 3 weeks, with thorough cardiac work-up benign. Described as "tight muscle", through to back. Not exacerbated by eating/drinking. Relieved when laying down. Denies tarry stools, does have reported hx of PUD in remote past (EGD at [facility]) in the setting of prolonged BC  powder use. Currently denies routine NSAIDs, aspirin powders. Needs further evaluation with EGD.   Proceed with upper endoscopy and possible dilation in the near future with Dr. Jena Gauss. The risks, benefits, and alternatives have been  discussed in detail with patient. They have stated understanding and desire to proceed.   Start Dexilant 60 mg daily (samples and rx provided) Avoid NSAIDs, aspirin powders Obtain op note from last EGD in remote past     Special screening for malignant neoplasms, colon - Gerrit Halls, NP  10/24/2010 10:02 PM  Signed No prior colonoscopy. Due for routine screening. No alarm features currently.    I have seen the patient prior to the procedure(s) today and reviewed the history and physical / consultation from 10/23/10.  Patient decided not to take Dexilant prescribed for her in the office. She thought it was for peptic ulcer disease. Otherwise, There have been no changes. After consideration of the risks, benefits, alternatives and imponderables, the patient has consented to the procedure(s).

## 2010-11-06 ENCOUNTER — Encounter (HOSPITAL_COMMUNITY): Payer: Self-pay | Admitting: Internal Medicine

## 2011-01-29 ENCOUNTER — Ambulatory Visit: Payer: PRIVATE HEALTH INSURANCE | Admitting: Gastroenterology

## 2011-07-16 ENCOUNTER — Encounter: Payer: PRIVATE HEALTH INSURANCE | Admitting: Physician Assistant

## 2011-07-16 ENCOUNTER — Encounter: Payer: PRIVATE HEALTH INSURANCE | Admitting: Adult Health

## 2011-07-23 ENCOUNTER — Encounter: Payer: PRIVATE HEALTH INSURANCE | Admitting: Adult Health

## 2011-07-24 ENCOUNTER — Encounter: Payer: PRIVATE HEALTH INSURANCE | Admitting: Adult Health

## 2011-12-25 ENCOUNTER — Other Ambulatory Visit (HOSPITAL_COMMUNITY): Payer: Self-pay | Admitting: Family Medicine

## 2011-12-25 DIAGNOSIS — Z139 Encounter for screening, unspecified: Secondary | ICD-10-CM

## 2012-01-05 ENCOUNTER — Ambulatory Visit (HOSPITAL_COMMUNITY)
Admission: RE | Admit: 2012-01-05 | Discharge: 2012-01-05 | Disposition: A | Discharge status: Home / Self Care | Payer: PRIVATE HEALTH INSURANCE | Source: Ambulatory Visit | Attending: Family Medicine | Admitting: Family Medicine

## 2012-01-05 DIAGNOSIS — Z1231 Encounter for screening mammogram for malignant neoplasm of breast: Secondary | ICD-10-CM | POA: Insufficient documentation

## 2012-01-05 DIAGNOSIS — Z139 Encounter for screening, unspecified: Secondary | ICD-10-CM

## 2012-01-08 ENCOUNTER — Other Ambulatory Visit: Payer: Self-pay | Admitting: Family Medicine

## 2012-01-08 DIAGNOSIS — R928 Other abnormal and inconclusive findings on diagnostic imaging of breast: Secondary | ICD-10-CM

## 2012-01-09 ENCOUNTER — Other Ambulatory Visit: Payer: Self-pay | Admitting: Family Medicine

## 2012-01-09 DIAGNOSIS — R928 Other abnormal and inconclusive findings on diagnostic imaging of breast: Secondary | ICD-10-CM

## 2012-01-13 ENCOUNTER — Other Ambulatory Visit (HOSPITAL_COMMUNITY): Payer: Self-pay | Admitting: Family Medicine

## 2012-01-13 DIAGNOSIS — R928 Other abnormal and inconclusive findings on diagnostic imaging of breast: Secondary | ICD-10-CM

## 2012-01-16 ENCOUNTER — Other Ambulatory Visit: Payer: PRIVATE HEALTH INSURANCE

## 2012-01-28 ENCOUNTER — Other Ambulatory Visit (HOSPITAL_COMMUNITY): Payer: PRIVATE HEALTH INSURANCE

## 2012-01-28 ENCOUNTER — Encounter (HOSPITAL_COMMUNITY): Payer: PRIVATE HEALTH INSURANCE

## 2012-01-28 ENCOUNTER — Ambulatory Visit (HOSPITAL_COMMUNITY)
Admission: RE | Admit: 2012-01-28 | Discharge: 2012-01-28 | Disposition: A | Discharge status: Home / Self Care | Payer: PRIVATE HEALTH INSURANCE | Source: Ambulatory Visit | Attending: Family Medicine | Admitting: Family Medicine

## 2012-01-28 DIAGNOSIS — R928 Other abnormal and inconclusive findings on diagnostic imaging of breast: Secondary | ICD-10-CM

## 2012-01-29 ENCOUNTER — Other Ambulatory Visit (HOSPITAL_COMMUNITY): Payer: PRIVATE HEALTH INSURANCE

## 2012-01-29 ENCOUNTER — Encounter (HOSPITAL_COMMUNITY): Payer: PRIVATE HEALTH INSURANCE

## 2012-12-02 ENCOUNTER — Ambulatory Visit (INDEPENDENT_AMBULATORY_CARE_PROVIDER_SITE_OTHER): Payer: PRIVATE HEALTH INSURANCE | Admitting: Cardiology

## 2012-12-02 ENCOUNTER — Encounter: Payer: Self-pay | Admitting: Cardiology

## 2012-12-02 VITALS — BP 128/89 | HR 65 | Ht 66.0 in | Wt 122.8 lb

## 2012-12-02 DIAGNOSIS — R0789 Other chest pain: Secondary | ICD-10-CM

## 2012-12-02 NOTE — Progress Notes (Addendum)
Clinical Summary Cheyenne Brown is a 52 y.o.female   1. Leg numbness - x 1-2 months, notes tingling sensation in feet, on bottom of feet. No pain, but can feel like pins and needles. Occasionally notes dark red color to both feet. No sores. Often brought on by standing on her feet for long periods of time. Works as Warden/ranger, and symptoms only occur when she has been on her feet long periods of time  Past Medical History  Diagnosis Date  . GERD (gastroesophageal reflux disease)   . History of tobacco abuse      15 pack years; discontinued 2008  . Head trauma 1996    depressed skull fracture and epidural hematoma following below with a blunt object; surgery and prolonged rehabilitation required  . Anemia     presumed related to metromenorrhagia  . Menometrorrhagia   . PUD (peptic ulcer disease)     per pt report, likely secondary to [facility] powders     No Known Allergies   Current Outpatient Prescriptions  Medication Sig Dispense Refill  . ALPRAZolam (XANAX) 1 MG tablet Take 1 mg by mouth 2 (two) times daily.        Marland Kitchen atenolol (TENORMIN) 25 MG tablet Take 25 mg by mouth daily. 1/2 tab daily per DR.Wilson [facility]       . citalopram (CELEXA) 20 MG tablet Take 20 mg by mouth daily.        . peg 3350 powder (MOVIPREP) 100 G SOLR Take 1 kit (100 g total) by mouth once. As directed Please purchase 1 Fleets enema to use with the prep  1 kit  0   No current facility-administered medications for this visit.     Past Surgical History  Procedure Laterality Date  . Crainiotomy  1996  . Neck surgery      x2  . Colonoscopy  10/30/2010    Procedure: COLONOSCOPY;  Surgeon: Corbin Ade, MD;  [facility];  Service: Endoscopy;  Laterality: N/A;  11:30AM  . Esophagogastroduodenoscopy  10/30/2010    Procedure: ESOPHAGOGASTRODUODENOSCOPY (EGD);  Surgeon: Corbin Ade, MD;  [facility]: [facility];  Service: Endoscopy;  Laterality: N/A;  Elease Hashimoto  dilation  10/30/2010    Procedure: Elease Hashimoto DILATION;  Surgeon: Corbin Ade, MD;  Location: [facility];  Service: Endoscopy;  Laterality: N/A;     No Known Allergies    Family History  Problem Relation Age of Onset  . Coronary artery disease Mother     CABG  . Coronary artery disease Brother     CABG-2011  . Coronary artery disease Sister     PCI  . Colon cancer Neg Hx   . Ulcers Father      Social History [name] reports that she quit smoking about 5 years ago. Her smoking use included Cigarettes. She has a 12.5 pack-year smoking history. She has never used smokeless tobacco. [name] reports that she does not drink alcohol.   Review of Systems 12 point ROS negative other than reported in HPI  Physical Examination p 65 bp 128/69 Wt 122 BMI 20 Gen: resting comfortably, NAD HEENT: no scleral icterus, pupils equal round and reactive, no palptable cervical adenopathy CV Pulm: CTAB Abd: soft, NT, ND NABS, no hepatosplenomegaly Ext: warm, no edema, 2+ DP/PT pulses Skin: warm, no rash Neuro: A&Ox3, no focal deficits    Diagnostic Studies 05/2010 echo: LVEF 60-65%, mild focal basal septal hypertrophy, no WMA, grade I  diastolic dysfunction  12/01/12 EKG: SR, normal axis, no ischemic changes  Assessment and Plan  1. Foot numbness - symptoms are not consistent w/ claudication or poor circulation. Patient has strong normal peripheral pulses. Do not recommend any further cardiovascular workup at this time, she is seeing her primary next week and I have asked her to discuss with him.  - follow up with cardiology as needed.   Antoine Poche, M.D., F.A.C.C.

## 2012-12-02 NOTE — Patient Instructions (Addendum)
Your physician recommends that you schedule a follow-up as needed   Your physician recommends that you continue on your current medications as directed. Please refer to the Current Medication list given to you today.  

## 2014-08-22 ENCOUNTER — Ambulatory Visit: Payer: PRIVATE HEALTH INSURANCE | Admitting: Orthopedic Surgery

## 2014-09-05 ENCOUNTER — Ambulatory Visit: Payer: PRIVATE HEALTH INSURANCE | Admitting: Orthopedic Surgery

## 2014-09-26 ENCOUNTER — Encounter: Payer: Self-pay | Admitting: Orthopedic Surgery

## 2014-09-26 ENCOUNTER — Ambulatory Visit (INDEPENDENT_AMBULATORY_CARE_PROVIDER_SITE_OTHER): Payer: PRIVATE HEALTH INSURANCE

## 2014-09-26 ENCOUNTER — Ambulatory Visit: Payer: PRIVATE HEALTH INSURANCE

## 2014-09-26 ENCOUNTER — Ambulatory Visit (INDEPENDENT_AMBULATORY_CARE_PROVIDER_SITE_OTHER): Payer: PRIVATE HEALTH INSURANCE | Admitting: Orthopedic Surgery

## 2014-09-26 VITALS — BP 141/84 | Ht 65.0 in | Wt 121.0 lb

## 2014-09-26 DIAGNOSIS — M67431 Ganglion, right wrist: Secondary | ICD-10-CM

## 2014-09-26 DIAGNOSIS — M67432 Ganglion, left wrist: Secondary | ICD-10-CM

## 2014-09-26 DIAGNOSIS — M67439 Ganglion, unspecified wrist: Secondary | ICD-10-CM

## 2014-09-26 NOTE — Progress Notes (Signed)
   Subjective:    Patient ID: Cheyenne MaxcyBonnie S Segar, female    DOB: 11/22/1960, 54 y.o.   MRN: [id number]  Chief Complaint  Patient presents with  . Wrist Pain    Bilateral wrist cyst, referred by Dr. Malvin JohnsBradford.    Wrist Pain    6 MONTHS MASSES BOTH WRISTS  BECOMING A NUISANCE    Review of Systems     Objective:   Physical Exam  Constitutional: She is oriented to person, place, and time. She appears well-developed and well-nourished. No distress.  HENT:  Head: Normocephalic and atraumatic.  Cardiovascular: Normal rate and intact distal pulses.   Bilateral normal allen tests   Musculoskeletal: Normal range of motion.  RIGHT AND LEFT WRIST Mass radial side volar aspect both wrists Normal grip Normal wrist stability Normal ROM   Lymphadenopathy:       Right: No epitrochlear adenopathy present.       Left: No epitrochlear adenopathy present.  Neurological: She is alert and oriented to person, place, and time. She has normal reflexes. She exhibits normal muscle tone. Coordination normal.  Skin: Skin is warm and dry. No rash noted. She is not diaphoretic. No erythema. No pallor.  Psychiatric: She has a normal mood and affect. Her behavior is normal. Judgment and thought content normal.  Nursing note and vitals reviewed.         Assessment & Plan:   Encounter Diagnoses  Name Primary?  . Ganglion cyst of wrist, left Yes  . Ganglion of right wrist     xrays eft wrist is normal  The patient once the left one removed because it's gotten bigger  I told her there is no harm in having them, they come back 15% of the time, we may have to sacrifice the radial artery.   She agrees to left wrist ganglion cyst excision( volar aspect radial aspect).

## 2014-09-26 NOTE — Patient Instructions (Signed)
SURGERY AUGUST 10 LEFT VOLUR GANGLION CYST REMOVAL

## 2014-10-18 ENCOUNTER — Encounter (HOSPITAL_COMMUNITY): Admission: RE | Payer: Self-pay | Source: Ambulatory Visit

## 2014-10-18 ENCOUNTER — Ambulatory Visit (HOSPITAL_COMMUNITY)
Admission: RE | Admit: 2014-10-18 | Payer: PRIVATE HEALTH INSURANCE | Source: Ambulatory Visit | Admitting: Orthopedic Surgery

## 2014-10-18 SURGERY — EXCISION, GANGLION CYST, WRIST
Anesthesia: Choice | Laterality: Left

## 2014-10-23 ENCOUNTER — Ambulatory Visit: Payer: PRIVATE HEALTH INSURANCE | Admitting: Orthopedic Surgery

## 2014-12-20 ENCOUNTER — Encounter: Payer: Self-pay | Admitting: Nurse Practitioner

## 2014-12-20 ENCOUNTER — Ambulatory Visit (INDEPENDENT_AMBULATORY_CARE_PROVIDER_SITE_OTHER): Payer: PRIVATE HEALTH INSURANCE | Admitting: Nurse Practitioner

## 2014-12-20 ENCOUNTER — Other Ambulatory Visit: Payer: Self-pay

## 2014-12-20 VITALS — BP 128/87 | HR 66 | Temp 97.3°F | Ht 66.0 in | Wt 128.4 lb

## 2014-12-20 DIAGNOSIS — R1310 Dysphagia, unspecified: Secondary | ICD-10-CM | POA: Diagnosis not present

## 2014-12-20 DIAGNOSIS — K5900 Constipation, unspecified: Secondary | ICD-10-CM | POA: Diagnosis not present

## 2014-12-20 DIAGNOSIS — K219 Gastro-esophageal reflux disease without esophagitis: Secondary | ICD-10-CM

## 2014-12-20 MED ORDER — OMEPRAZOLE 20 MG PO CPDR: 20.0000 mg | DELAYED_RELEASE_CAPSULE | Freq: Every day | ORAL | Status: DC

## 2014-12-20 NOTE — Progress Notes (Signed)
CC'ED TO PCP 

## 2014-12-20 NOTE — Patient Instructions (Signed)
1. We will schedule you your esophagram (special x-ray) for you. 2. Start taking Prilosec 20 mg once a day, 30 minutes before your first meal the day. 3. Start taking Linzess 290 g once daily. We'll provide samples for 2 weeks. Side effects may include diarrhea, which tends to improve after a couple weeks. 4. Also with the results of your medications, specifically how often you're having a bowel movement and a pain is any better. Also notify us if any side effects are intolerable. 5. Return for follow-up in 2 months.

## 2014-12-20 NOTE — Progress Notes (Signed)
Referring Provider: Barbie BannerWilson, Fred H, MD Primary Care Physician:  Pamelia HoitWILSON,FRED HENRY, MD Primary GI:  Dr. Jena Gaussourk  Chief Complaint  Patient presents with  . Dysphagia  . Bloated    HPI:   [id number] rolled female presents for follow-up on dysphagia and GERD symptoms. Last seen in our office 10/23/2010. Last colonoscopy 10/30/2010 which showed normal rectum and colon, anal papilla, otherwise normal. Repeat screening in 10 years. Currently up-to-date on her colonoscopy. [facility] found normal tubular esophagus, normal stomach, normal duodenum through the second portion. Maloney dilation was accomplished with a 50 French dilator recommend swallow evaluation if further symptoms.  Today she states her symptoms never really improved after last visit/EGD. Has a history of anterior neck surgery. Dysphagia symptoms occur with every meal. Solid food and pill dysphagia. Everything has to be chewed very fine. Has occasional problems with liquids. Has regurgitation at times. No blood in regurgitation. GERD symptoms are "terrible." Symptoms include bloating, esophageal burning, minimal epigastric pain but does have epigastric "discomfort" which is worse if she bends forward while symptomatic, raw mouth, bitter taste. States it's worse later afternoon/evening. Occasional nausea. No vomiting. Has been avoiding fried foods. Denies NSAIDs. ASA powders 1-2 times a month for severe headache. Has a history of GERD, became worse in the past few weeks, has been under a lot of stress in that time as well. Has a lifelong history of constipation. Has had to be disimpacted previously. Is taking OTC laxative, Miralax, and occasional correctol (OTC laxative.). Denies hematochezia and melena. States she cannot go on her own if she doesn't take anything. If she's taking OTC medications, will go about twice a week. OTC medications cause abdominal discomfort and looser stools. Sensation of incomplete  emptying. Having a bowel movement results in moderate relief of abdominal discomfort. Denies fever, chills, unintentional weight loss. Denies chest pain, dyspnea, dizziness, lightheadedness, syncope, near syncope. Denies any other upper or lower GI symptoms.    Past Medical History  Diagnosis Date  . GERD (gastroesophageal reflux disease)   . History of tobacco abuse      15 pack years; discontinued 2008  . Head trauma 1996    depressed skull fracture and epidural hematoma following below with a blunt object; surgery and prolonged rehabilitation required  . Anemia     presumed related to metromenorrhagia  . Menometrorrhagia   . PUD (peptic ulcer disease)     per pt report, likely secondary to [facility] powders  . Constipation   . Hypertension     Past Surgical History  Procedure Laterality Date  . Crainiotomy  1996  . Neck surgery  2008 & 2011    x2  . Colonoscopy  10/30/2010    RMR: Anal papilla, otherwise normal rectum and colon (melanosis coli)  . Esophagogastroduodenoscopy  10/30/2010    RMR: Normal tubular esophagus. Staus post of maloney dilation as described above. . Normal stomach, duodenum through the second portion  . Maloney dilation  10/30/2010    Procedure: Elease HashimotoMALONEY DILATION;  Surgeon: Corbin Adeobert M Rourk, MD;  [facility];  Service: Endoscopy;  Laterality: N/A;    Current Outpatient Prescriptions  Medication Sig Dispense Refill  . ALPRAZolam (XANAX) 1 MG tablet Take 1 mg by mouth 2 (two) times daily.      Marland Kitchen. atenolol (TENORMIN) 25 MG tablet Take 25 mg by mouth daily. 1/2 tab daily per DR.Wilson [facility]     . cyclobenzaprine (FLEXERIL) 10 MG tablet  Take 10 mg by mouth 2 (two) times daily as needed for muscle spasms.     No current facility-administered medications for this visit.    Allergies as of 12/20/2014  . (No Known Allergies)    Family History  Problem Relation Age of Onset  . Coronary artery disease Mother     CABG  .  Coronary artery disease Brother     CABG-2011  . Coronary artery disease Sister     PCI  . Colon cancer Neg Hx   . Ulcers Father     Social History   Social History  . Marital Status: Married    Spouse Name: N/A  . Number of Children: 2  . Years of Education: N/A   Occupational History  . 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 Smoking status: Former Smoker -- 0.50 packs/day for 25 years    Types: Cigarettes    Quit date: 05/29/2007  . Smokeless tobacco: Never Used  . Alcohol Use: No  . Drug Use: No  . Sexual Activity: Not Asked   Other Topics Concern  . None   Social History Narrative    Review of Systems: General: Negative for anorexia, weight loss, fever, chills, fatigue, weakness. Eyes: Negative for vision changes.  ENT: Negative for hoarseness. CV: Negative for chest pain, angina, palpitations, peripheral edema.  Respiratory: Negative for dyspnea at rest, cough, sputum, wheezing.  GI: See history of present illness. Derm: Negative for rash or itching.  Endo: Negative for unusual weight change.  Heme: Negative for bruising or bleeding. Allergy: Negative for rash or hives.   Physical Exam: BP 128/87 mmHg  Pulse 66  Temp(Src) 97.3 F (36.3 C)  Ht  (1.676 m)  Wt 128 lb 6.4 oz (58.242 kg)  BMI 20.73 kg/m2 General:   Alert and oriented. Pleasant and cooperative. Well-nourished and well-developed.  Head:  Normocephalic and atraumatic. Eyes:  Without icterus, sclera clear and conjunctiva pink.  Ears:  Normal auditory acuity. Cardiovascular:  S1, S2 present without murmurs appreciated. Normal pulses noted. Extremities without clubbing or edema. Respiratory:  Clear to auscultation bilaterally. No wheezes, rales, or rhonchi. No distress.  Gastrointestinal:  +BS, soft, and non-distended. Mild epigastric TTP. No HSM noted. No guarding or rebound. No masses appreciated.  Rectal:  Deferred  Skin:  Intact without  significant lesions or rashes. Neurologic:  Alert and oriented x4;  grossly normal neurologically. Psych:  Alert and cooperative. Normal mood and affect. Heme/Lymph/Immune: No excessive bruising noted.    12/20/2014 2:02 PM

## 2014-12-20 NOTE — Assessment & Plan Note (Signed)
Patient with constipation for the majority of her lifetime. Her symptoms gotten progressively worse in the past 2-3 weeks at which time she was under increased stress. She is attempted to treat her constipation with over-the-counter medications unsuccessfully. Her constipation is associated with abdominal pain which is relieved with bowel movement. She seems that the picture of irritable bowel constipation. She is essentially tried all the regimens over-the-counter that we would normally recommend. We will start her on Linzess 290 g a day. Instructed her as to the potential side effects including diarrhea which has been shown self resolved in 1-2 weeks. She will call us with the results of the medication in 2-3 weeks. Based on the effectiveness we can consider dose titration and/or agent switching if needed. Follow-up in 2 months.

## 2014-12-20 NOTE — Assessment & Plan Note (Signed)
Patient with a history of dysphagia. She was last seen for this in 2012 status post anterior neck surgery. Her endoscopy was essentially normal and advised to let symptoms try to resolve over the course of several months. She states [facility] her symptoms never really improved. At this point we will set her up for a barium pill esophagram and swallow evaluation to look at functional esophageal motility. Depending on results we can consider referring to speech lying which pathology. Possible component of GERD related dysphagia as well. Encouraged her to continue following the diet precautions she is following. Return for follow-up in 2 months.

## 2014-12-20 NOTE — Assessment & Plan Note (Addendum)
Patient with a long-standing history of GERD. Symptoms became worse within the past 2-3 weeks. Of note she has had increased stress during this time. She is not currently on a PPI. This point we will start her on Prilosec 20 mg once a day and return for follow-up in 2 months to see if she is improved. Not currently taking NSAIDs. Does take aspirin powders rarely. Advised to avoid as much as possible.

## 2014-12-27 ENCOUNTER — Ambulatory Visit (HOSPITAL_COMMUNITY)
Admission: RE | Admit: 2014-12-27 | Discharge: 2014-12-27 | Disposition: A | Discharge status: Home / Self Care | Payer: PRIVATE HEALTH INSURANCE | Source: Ambulatory Visit | Attending: Nurse Practitioner | Admitting: Nurse Practitioner

## 2014-12-27 ENCOUNTER — Telehealth: Payer: Self-pay | Admitting: Internal Medicine

## 2014-12-27 DIAGNOSIS — R1310 Dysphagia, unspecified: Secondary | ICD-10-CM | POA: Insufficient documentation

## 2014-12-27 DIAGNOSIS — K219 Gastro-esophageal reflux disease without esophagitis: Secondary | ICD-10-CM

## 2014-12-27 NOTE — Telephone Encounter (Signed)
I spoke with the pt, discussed linzess with her, she has only been taking it for a few days. She is going to give it until Monday and if she is still not having good bm's she will call and let me know.

## 2014-12-27 NOTE — Telephone Encounter (Signed)
Pt called to change her OV to a Wednesday and is aware of new appt.  She has questions about the Linzess she's taking. She said the first couple of days she had a BM and then she would go 3-4 days and not go at all.  Please call her back at [phone]

## 2015-01-04 ENCOUNTER — Telehealth: Payer: Self-pay | Admitting: Internal Medicine

## 2015-01-04 NOTE — Telephone Encounter (Signed)
Patient had gotten a coupon card for Linzess and she needs the prescription (Linzess) called into [facility].

## 2015-01-04 NOTE — Telephone Encounter (Signed)
She just called this morning. The request was sent to the refill box this morning.

## 2015-01-04 NOTE — Telephone Encounter (Signed)
Routing to the refill box. 

## 2015-01-04 NOTE — Telephone Encounter (Signed)
[phone] [id number] PATIENT STATED HER PHARMACY STILL DOES NOT HAVE HER LINZESS PRESCRIPTION.

## 2015-01-05 ENCOUNTER — Telehealth: Payer: Self-pay | Admitting: Internal Medicine

## 2015-01-05 MED ORDER — LINACLOTIDE 290 MCG PO CAPS: 290.0000 ug | ORAL_CAPSULE | Freq: Every day | ORAL | Status: DC

## 2015-01-05 NOTE — Telephone Encounter (Signed)
rx done

## 2015-01-05 NOTE — Telephone Encounter (Signed)
Routing to the refill box. 

## 2015-01-05 NOTE — Telephone Encounter (Signed)
Pt called again this morning upset because her pharmacy still does not have her Linzess prescription. She took her last one this morning and does not want to go all weekend without it and said, "This is getting ridiculous and I want my prescription Today!"  I told her that the message was in the refill box along with others and EG would get to those when he isn't seeing patients. Then she said that her husband works at [employer] next door and she would just send him over here to pick up a written prescription.  I told her that EG wasn't in the office yet that he was rounding at the hospital and we close at noon today. She said he would be here at 1130 and to have the prescription ready. Call ended.

## 2015-01-05 NOTE — Addendum Note (Signed)
Addended by: Tiffany KocherLEWIS, Niki Payment S on: 01/05/2015 09:41 AM   Modules accepted: Orders

## 2015-01-05 NOTE — Telephone Encounter (Addendum)
Pt called multiple times yesterday and this morning. She doesn't have any linzess left and we do not have any more samples to give her. She is wanting an rx for linzess sent to the pharmacy. Routing to the refill box.

## 2015-01-05 NOTE — Telephone Encounter (Signed)
This was handled earlier this morning. RX sent to [facility] at 9:41. I verified with pharmacy that they have received RX.

## 2015-02-14 ENCOUNTER — Ambulatory Visit: Payer: PRIVATE HEALTH INSURANCE | Admitting: Nurse Practitioner

## 2015-02-19 ENCOUNTER — Ambulatory Visit: Payer: PRIVATE HEALTH INSURANCE | Admitting: Nurse Practitioner

## 2015-03-07 ENCOUNTER — Ambulatory Visit (INDEPENDENT_AMBULATORY_CARE_PROVIDER_SITE_OTHER): Payer: PRIVATE HEALTH INSURANCE | Admitting: Nurse Practitioner

## 2015-03-07 ENCOUNTER — Encounter: Payer: Self-pay | Admitting: Nurse Practitioner

## 2015-03-07 VITALS — BP 130/87 | HR 63 | Temp 97.3°F | Ht 66.0 in | Wt 125.6 lb

## 2015-03-07 DIAGNOSIS — K219 Gastro-esophageal reflux disease without esophagitis: Secondary | ICD-10-CM | POA: Diagnosis not present

## 2015-03-07 DIAGNOSIS — R1310 Dysphagia, unspecified: Secondary | ICD-10-CM | POA: Diagnosis not present

## 2015-03-07 DIAGNOSIS — K5900 Constipation, unspecified: Secondary | ICD-10-CM | POA: Diagnosis not present

## 2015-03-07 MED ORDER — OMEPRAZOLE 20 MG PO CPDR: 20.0000 mg | DELAYED_RELEASE_CAPSULE | Freq: Every day | ORAL | Status: DC

## 2015-03-07 MED ORDER — LINACLOTIDE 290 MCG PO CAPS: 290.0000 ug | ORAL_CAPSULE | Freq: Every day | ORAL | Status: DC

## 2015-03-07 NOTE — Patient Instructions (Signed)
1. Assistant in a 90 day supply of your acid blocker (Prilosec) to your pharmacy. 2. I also sent in a refill of Linzess for 90 day supply. We will give you a co-pay card to help with co-pay. 3. Continue taking both of these medications. 4. Swallowing precautions as we discussed. 5. Return for follow-up as needed for any return or worsening symptoms.

## 2015-03-07 NOTE — Assessment & Plan Note (Addendum)
Constipation symptoms have resolved. She is very happy with her regimen has been able to stop taking all over-the-counter bowel regimens. Her only issue is Linzess can be expensive for her although she has decided that it is worth it for her. I will send in a refill for 90 day supply of Linzess with 3 refills. Also provide co-pay card to cut her co-pay to $30 per 90 day refill. Return for follow-up as needed.

## 2015-03-07 NOTE — Assessment & Plan Note (Signed)
Vision with continued dysphagia although it is no worse and has been. Barium pill esophagram demonstrated pill sticking in the cervical area with nonspecific dysmotility disorder. Discussed the meaning of this with the patient, no stricture or narrowing noted to be dilated. We discussed dietary precautions nor to prevent swallowing issues. She states she has been following his so far and is become use to them and they're working quite well for her. Continue dietary modifications, swallowing precautions, return for follow-up as needed.

## 2015-03-07 NOTE — Assessment & Plan Note (Signed)
And is much improved on Prilosec. Patient is on most is requesting a medication refill for 90 day supply. Recommend she continue taking Prilosec, continue dietary changes, refill sent to pharmacy for 90 day supply with 3 refills. Return for follow-up as needed for any worsening or recurrent symptoms.

## 2015-03-07 NOTE — Progress Notes (Signed)
CC'ED TO PCP 

## 2015-03-07 NOTE — Progress Notes (Signed)
Referring Provider: Barbie BannerWilson, Fred H, MD Primary Care Physician:  Pamelia HoitWILSON,FRED HENRY, MD Primary GI:  Dr. Jena Gaussourk  Chief Complaint  Patient presents with  . Dysphagia    HPI:   54 year old female presents for follow-up on dysphagia. Last seen in our office 12/20/2014 which she noted persistent GERD and dysphagia symptoms. Last endoscopy completed in 2012 which found normal tubular esophagus, status post Maloney dilation, normal stomach, and duodenum throughout the second portion. Barium pill esophagram was ordered at her last visit which found nonspecific esophageal motility disorder. Her GERD symptoms she was started on Prilosec 20 mg once a day. Colonoscopy is up-to-date completed in 10/30/2010 with recommended 10 year repeat screening. She was also started on Linzess for constipation  Today she states her constipation and GERD are improved. Linzess and Prilosec have helped greatly. Linzess is expensive for her but is willing to pay for it. Has a bowel movement daily which is consistent with [place] 4 without straining. Occasional breakthrough GERD symptoms, but overall much improved, less belching and pain. Dysphagia symptoms are persistent but no worse. She has been following swallowing precautions and is managing well. Denies chest pain, dyspnea, dizziness, lightheadedness, syncope, near syncope. Denies any other upper or lower GI symptoms.  Past Medical History  Diagnosis Date  . GERD (gastroesophageal reflux disease)   . History of tobacco abuse      15 pack years; discontinued 2008  . Head trauma 1996    depressed skull fracture and epidural hematoma following below with a blunt object; surgery and prolonged rehabilitation required  . Anemia     presumed related to metromenorrhagia  . Menometrorrhagia   . PUD (peptic ulcer disease)     per pt report, likely secondary to [facility] powders  . Constipation   . Hypertension     Past Surgical History  Procedure Laterality Date  .  Crainiotomy  1996  . Neck surgery  2008 & 2011    x2  . Colonoscopy  10/30/2010    RMR: Anal papilla, otherwise normal rectum and colon (melanosis coli)  . Esophagogastroduodenoscopy  10/30/2010    RMR: Normal tubular esophagus. Staus post of maloney dilation as described above. . Normal stomach, duodenum through the second portion  . Maloney dilation  10/30/2010    Procedure: Elease HashimotoMALONEY DILATION;  Surgeon: Corbin Adeobert M Rourk, MD;  Location: [facility];  Service: Endoscopy;  Laterality: N/A;    Current Outpatient Prescriptions  Medication Sig Dispense Refill  . ALPRAZolam (XANAX) 1 MG tablet Take 1 mg by mouth 2 (two) times daily.      Marland Kitchen. atenolol (TENORMIN) 25 MG tablet Take 25 mg by mouth daily. 1/2 tab daily per DR.Wilson [facility]     . cyclobenzaprine (FLEXERIL) 10 MG tablet Take 10 mg by mouth 2 (two) times daily as needed for muscle spasms.    . Linaclotide (LINZESS) 290 MCG CAPS capsule Take 1 capsule (290 mcg total) by mouth daily. On empty stomach. 30 capsule 11  . omeprazole (PRILOSEC) 20 MG capsule Take 1 capsule (20 mg total) by mouth daily. 30 capsule 2   No current facility-administered medications for this visit.    Allergies as of 03/07/2015  . (No Known Allergies)    Family History  Problem Relation Age of Onset  . Coronary artery disease Mother     CABG  . Coronary artery disease Brother     CABG-2011  . Coronary artery disease Sister     PCI  .  Colon cancer Neg Hx   . Ulcers Father     Social History   Social History  . Marital Status: Married    Spouse Name: N/A  . Number of Children: 2  . Years of Education: N/A   Occupational History  . 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 Smoking status: Former Smoker -- 0.50 packs/day for 25 years    Types: Cigarettes    Quit date: 05/29/2007  . Smokeless tobacco: Never Used  . Alcohol Use: No  . Drug Use: No  . Sexual Activity:  Not on file   Other Topics Concern  . Not on file   Social History Narrative    Review of Systems: General: Negative for anorexia, weight loss, fever, chills, fatigue, weakness. Eyes: Negative for vision changes.  CV: Negative for chest pain, angina, palpitations, peripheral edema.  Respiratory: Negative for dyspnea at rest, cough, sputum, wheezing.  GI: See history of present illness. Endo: Negative for unusual weight change.    Physical Exam: BP 130/87 mmHg  Pulse 63  Temp(Src) 97.3 F (36.3 C) (Oral)  Ht  (1.676 m)  Wt 125 lb 9.6 oz (56.972 kg)  BMI 20.28 kg/m2 General:   Alert and oriented. Pleasant and cooperative. Well-nourished and well-developed.  Head:  Normocephalic and atraumatic. Cardiovascular:  S1, S2 present without murmurs appreciated. Extremities without clubbing or edema. Respiratory:  Clear to auscultation bilaterally. No wheezes, rales, or rhonchi. No distress.  Gastrointestinal:  +BS, soft, non-tender and non-distended. No HSM noted. No guarding or rebound. No masses appreciated.  Rectal:  Deferred  Neurologic:  Alert and oriented x4;  grossly normal neurologically. Psych:  Alert and cooperative. Normal mood and affect.    03/07/2015 12:15 PM

## 2015-03-14 ENCOUNTER — Other Ambulatory Visit: Payer: Self-pay | Admitting: Nurse Practitioner

## 2015-07-06 ENCOUNTER — Telehealth: Payer: Self-pay

## 2015-07-06 ENCOUNTER — Telehealth: Payer: Self-pay | Admitting: Internal Medicine

## 2015-07-06 NOTE — Telephone Encounter (Signed)
PATIENT HAS QUESTION ABOUT LINZESS, HAS GOTTEN BAD INDIGESTION LATELY AND WANTS TO KNOW IF THAT COULD BE A SIDE EFFECT    PLEASE ADVISE  [phone]

## 2015-07-06 NOTE — Telephone Encounter (Signed)
Pt was returning a call from DS. Please call her back at [phone]

## 2015-07-06 NOTE — Telephone Encounter (Signed)
See previous note dated 07/06/2015.

## 2015-07-06 NOTE — Telephone Encounter (Signed)
LMOM to call.

## 2015-07-06 NOTE — Telephone Encounter (Signed)
I spoke to pt and she said the Linzess is working great and she takes one daily. She has skipped some days of the Omeprazole and complains of bloating and nausea. I told her to try the Omeprazole daily as prescribed and see if that helps. Call and let us know if she is still having symptoms.

## 2015-07-11 NOTE — Telephone Encounter (Signed)
Agree, no further recommendations. We can provide Linzess refills as needed since it is working

## 2015-07-12 ENCOUNTER — Telehealth: Payer: Self-pay | Admitting: Internal Medicine

## 2015-07-12 DIAGNOSIS — R112 Nausea with vomiting, unspecified: Secondary | ICD-10-CM

## 2015-07-12 MED ORDER — SUCRALFATE 1 GM/10ML PO SUSP: 1.0000 g | Freq: Four times a day (QID) | ORAL | Status: DC

## 2015-07-12 NOTE — Telephone Encounter (Signed)
PATIENT SICK EVERY NIGHT, TAKING STOMACH PILLS BUT ITS WORSE AT NIGHT.  ACID COMING UP WHEN LAYING DOWN.  [phone]  PLEASE ADVISE

## 2015-07-12 NOTE — Telephone Encounter (Signed)
Pt is aware. She is having 2-3 episodes of diarrhea daily. She has not been on any recent abx. No fever. She is going to stop the linzess and try the carafate and let us know how she is doing.

## 2015-07-12 NOTE — Addendum Note (Signed)
Addended by: Delane GingerGILL, Doni Bacha A on: 07/12/2015 03:30 PM   Modules accepted: Orders

## 2015-07-12 NOTE — Telephone Encounter (Signed)
See previous phone note.  

## 2015-07-12 NOTE — Telephone Encounter (Signed)
When she called on 4/28 she didn't mention anything about diarrhea. Last OV stated Linzess was working very well. It would seem odd to suddenly develop diarrhea side effects several months after starting the medication. Regardless, she can hold it for now due to diarrhea. Continue Omeprazole, I will send in Carafate to help. Ask how many stools a day she is having. She may need stool studies.

## 2015-07-12 NOTE — Telephone Encounter (Signed)
Pt called to see if the nurse has heard anything from provider regarding her medicine and side effects. I told her that JL was not available and once the provider gives recommendations that she would call her.

## 2015-07-12 NOTE — Telephone Encounter (Signed)
Routing to EG 

## 2015-07-12 NOTE — Telephone Encounter (Signed)
Spoke with the pt, she said she thinks she is having side effects of linzess. She is having diarrhea, bloating and nausea. No vomiting. This has been going on for 3 weeks. She is taking her omeprazole every day. No fever. She said the last two nights have been miserable and she feels so bad today that she is not going to work. Advised her to stop the medication and I would let EG know what was going on and get recommendations.

## 2015-07-16 ENCOUNTER — Telehealth: Payer: Self-pay | Admitting: Internal Medicine

## 2015-07-16 NOTE — Telephone Encounter (Signed)
Yes, can provide work note. She can also pick up samples of Linzess 145 to see if that helps without causing diarrhea (if 290 is too much). Or, 290 every other day. Whichever she prefers.

## 2015-07-16 NOTE — Telephone Encounter (Signed)
I have spoken with the pt, she said she is constipated now that she stopped the linzess d/t diarrhea. Pt wanted to know if she could restart her linzess. I advised her to restart it but to try it every other day for now and to come in on Wednesday for her appt. Looks like pt now wants to cancel her appt. Minerva Areolaric can we give her a work note?

## 2015-07-16 NOTE — Telephone Encounter (Signed)
Pt called back to say that she had talked with the nurse and to cancel to OV for Wednesday, but she wants a work note from EG that she shouldn't be bending over at work because it makes her nauseated. Please advise about this and call her back at [phone]

## 2015-07-16 NOTE — Telephone Encounter (Signed)
Tried to call pt- NA- LMOM 

## 2015-07-16 NOTE — Telephone Encounter (Signed)
Pt called to make OV to see EG on Wednesday this week, She said the Linzess isn't helping her and she needs something to help her go to the bathroom. She asked for the nurse to call her back on her cell phone.  [phone]

## 2015-07-16 NOTE — Telephone Encounter (Signed)
Pt aware. Letter done and at the front desk.

## 2015-07-18 ENCOUNTER — Ambulatory Visit: Payer: PRIVATE HEALTH INSURANCE | Admitting: Nurse Practitioner

## 2015-07-19 ENCOUNTER — Telehealth: Payer: Self-pay

## 2015-07-19 MED ORDER — SUCRALFATE 1 G PO TABS: 1.0000 g | ORAL_TABLET | Freq: Three times a day (TID) | ORAL | Status: DC

## 2015-07-19 NOTE — Telephone Encounter (Signed)
Pt called, she has been getting carafate suspension for $40 a month. She just found out that she can get the tablets for $10 a month. She is [facility]. She said it is helping and she feels much better now. Spoke with AS and it is ok to change rx to tablets.

## 2016-05-23 ENCOUNTER — Ambulatory Visit: Payer: PRIVATE HEALTH INSURANCE | Admitting: Gastroenterology

## 2017-12-29 ENCOUNTER — Other Ambulatory Visit (HOSPITAL_COMMUNITY): Payer: Self-pay | Admitting: Family Medicine

## 2017-12-29 DIAGNOSIS — Z1231 Encounter for screening mammogram for malignant neoplasm of breast: Secondary | ICD-10-CM

## 2018-01-20 ENCOUNTER — Ambulatory Visit (HOSPITAL_COMMUNITY): Payer: PRIVATE HEALTH INSURANCE

## 2018-01-27 ENCOUNTER — Ambulatory Visit (HOSPITAL_COMMUNITY): Payer: PRIVATE HEALTH INSURANCE

## 2018-02-10 ENCOUNTER — Ambulatory Visit (HOSPITAL_COMMUNITY): Payer: PRIVATE HEALTH INSURANCE

## 2018-03-17 ENCOUNTER — Ambulatory Visit (HOSPITAL_COMMUNITY): Payer: PRIVATE HEALTH INSURANCE

## 2018-04-07 ENCOUNTER — Ambulatory Visit (HOSPITAL_COMMUNITY)
Admission: RE | Admit: 2018-04-07 | Discharge: 2018-04-07 | Disposition: A | Discharge status: Home / Self Care | Payer: PRIVATE HEALTH INSURANCE | Source: Ambulatory Visit | Attending: Family Medicine | Admitting: Family Medicine

## 2018-04-07 ENCOUNTER — Encounter (HOSPITAL_COMMUNITY): Payer: Self-pay

## 2018-04-07 DIAGNOSIS — Z1231 Encounter for screening mammogram for malignant neoplasm of breast: Secondary | ICD-10-CM

## 2018-10-06 ENCOUNTER — Other Ambulatory Visit (HOSPITAL_COMMUNITY): Payer: Self-pay | Admitting: Neurosurgery

## 2018-10-06 ENCOUNTER — Other Ambulatory Visit: Payer: Self-pay | Admitting: Neurosurgery

## 2018-10-06 DIAGNOSIS — M5416 Radiculopathy, lumbar region: Secondary | ICD-10-CM

## 2018-10-20 ENCOUNTER — Ambulatory Visit (HOSPITAL_COMMUNITY)
Admission: RE | Admit: 2018-10-20 | Discharge: 2018-10-20 | Disposition: A | Discharge status: Home / Self Care | Payer: PRIVATE HEALTH INSURANCE | Source: Ambulatory Visit | Attending: Neurosurgery | Admitting: Neurosurgery

## 2018-10-20 ENCOUNTER — Other Ambulatory Visit: Payer: Self-pay

## 2018-10-20 DIAGNOSIS — M5416 Radiculopathy, lumbar region: Secondary | ICD-10-CM | POA: Insufficient documentation

## 2018-11-29 ENCOUNTER — Other Ambulatory Visit: Payer: Self-pay | Admitting: Neurosurgery

## 2018-12-07 ENCOUNTER — Other Ambulatory Visit (HOSPITAL_COMMUNITY)
Admission: RE | Admit: 2018-12-07 | Discharge: 2018-12-07 | Disposition: A | Discharge status: Home / Self Care | Payer: PRIVATE HEALTH INSURANCE | Source: Ambulatory Visit | Attending: Neurosurgery | Admitting: Neurosurgery

## 2018-12-07 ENCOUNTER — Encounter (HOSPITAL_COMMUNITY): Payer: Self-pay

## 2018-12-07 ENCOUNTER — Encounter (HOSPITAL_COMMUNITY)
Admission: RE | Admit: 2018-12-07 | Discharge: 2018-12-07 | Disposition: A | Discharge status: Home / Self Care | Payer: PRIVATE HEALTH INSURANCE | Source: Ambulatory Visit | Attending: Neurosurgery | Admitting: Neurosurgery

## 2018-12-07 ENCOUNTER — Other Ambulatory Visit: Payer: Self-pay

## 2018-12-07 DIAGNOSIS — I10 Essential (primary) hypertension: Secondary | ICD-10-CM | POA: Insufficient documentation

## 2018-12-07 DIAGNOSIS — Z20828 Contact with and (suspected) exposure to other viral communicable diseases: Secondary | ICD-10-CM | POA: Insufficient documentation

## 2018-12-07 DIAGNOSIS — Z01818 Encounter for other preprocedural examination: Secondary | ICD-10-CM | POA: Insufficient documentation

## 2018-12-07 LAB — CBC
HCT: 36 % (ref 36.0–46.0)
Hemoglobin: 11.8 g/dL — ABNORMAL LOW (ref 12.0–15.0)
MCH: 30.6 pg (ref 26.0–34.0)
MCHC: 32.8 g/dL (ref 30.0–36.0)
MCV: 93.3 fL (ref 80.0–100.0)
Platelets: 167 10*3/uL (ref 150–400)
RBC: 3.86 MIL/uL — ABNORMAL LOW (ref 3.87–5.11)
RDW: 12 % (ref 11.5–15.5)
WBC: 11 10*3/uL — ABNORMAL HIGH (ref 4.0–10.5)
nRBC: 0 % (ref 0.0–0.2)

## 2018-12-07 LAB — BASIC METABOLIC PANEL
Anion gap: 10 (ref 5–15)
BUN: <5 mg/dL — ABNORMAL LOW (ref 6–20)
CO2: 27 mmol/L (ref 22–32)
Calcium: 8.8 mg/dL — ABNORMAL LOW (ref 8.9–10.3)
Chloride: 101 mmol/L (ref 98–111)
Creatinine, Ser: 0.56 mg/dL (ref 0.44–1.00)
GFR calc Af Amer: >60 mL/min (ref >60)
GFR calc non Af Amer: >60 mL/min (ref >60)
Glucose, Bld: 96 mg/dL (ref 70–99)
Potassium: 3.7 mmol/L (ref 3.5–5.1)
Sodium: 138 mmol/L (ref 135–145)

## 2018-12-07 LAB — SURGICAL PCR SCREEN
MRSA, PCR: NEGATIVE
Staphylococcus aureus: NEGATIVE

## 2018-12-07 LAB — SARS CORONAVIRUS 2 (TAT 6-24 HRS): SARS Coronavirus 2: NEGATIVE

## 2018-12-07 MED ORDER — CHLORHEXIDINE GLUCONATE CLOTH 2 % EX PADS: 6.0000 | MEDICATED_PAD | Freq: Once | CUTANEOUS | Status: DC

## 2018-12-07 NOTE — H&P (Signed)
Patient ID:   [id number] Patient: Cheyenne Brown  Date of Birth: 02-Jul-1960 Visit Type: Office Visit   Date: 10/27/2018 12:30 PM Provider: Marchia Meiers. Vertell Limber MD   This 58 year old female presents for leg pain f/u.  HISTORY OF PRESENT ILLNESS:  1.  leg pain f/u  The patient comes in today complaining of severe pain and says that her pain is 10.  It is going into her left leg.  Her MRI of the lumbar spine demonstrates disc herniations at both the L4-5 level on the left with left sided nerve root compression and also the L5-S1 level on the left.  The patient is complaining of pain going down her entire left leg.  We discussed treatment options.  I told her if he did not improve with epidural steroid injection is then I would favor proceeding with left L4-5 and left L5-S1 microdiskectomy.         Medical/Surgical/Interim History Reviewed, no change.  Last detailed document date:03/30/2013.     PAST MEDICAL HISTORY, SURGICAL HISTORY, FAMILY HISTORY, SOCIAL HISTORY AND REVIEW OF SYSTEMS I have reviewed the patient's past medical, surgical, family and social history as well as the comprehensive review of systems as included on the [facility] history form dated 11/25/2017, which I have signed.  Family History:  Reviewed, no changes.  Last detailed document date:03/30/2013.   Social History: Reviewed, no changes. Last detailed document date: 03/30/2013.    MEDICATIONS: (added, continued or stopped this visit) Started Medication Directions Instruction Stopped  12/26/2015 cyclobenzaprine 10 mg tablet take 1 tablet by oral route 3 times every day    10/20/2018 hydrocodone 10 mg-acetaminophen 325 mg tablet take 1 tablet by oral route  twice a day as needed for pain    05/04/2018 Neurontin 300 mg capsule take 1 capsule by oral route qHS x3, then BID x3days, then TID       ALLERGIES: Ingredient Reaction Medication Name Comment  NO KNOWN ALLERGIES     No  known allergies.    PHYSICAL EXAM:   Vitals Date Temp F BP Pulse Ht In Wt Lb BMI BSA Pain Score  10/27/2018 97.2 147/91 87 66 118 19.05  10/10      IMPRESSION:   The patient wants to see if he better with an injection and I have set this up for the 27th of August.  She will follow-up with me after that.  PLAN:  Follow-up after lumbar injection with consideration of left L4-5 and L5-S1 microdiscectomies if she does not improve with injections.  Orders: Office Procedures/Services: Assessment Service Comments  M54.16 Lumbar Spine - Transforaminal - left - L4-L5 - L5-S1    Instruction(s)/Education: Assessment Instruction  R03.0 Hypertension education   Completed Orders (this encounter) Order Details Reason Side Interpretation Result Initial Treatment Date Region  Hypertension education Continue to monitor blood pressure, if blood pressure remains elevated contact primary doctor         Assessment/Plan   # Detail Type Description   1. Assessment Low back pain, unspecified back pain laterality, with sciatica presence unspecified (M54.5).       2. Assessment Radiculopathy, lumbar region (M54.16).   Plan Orders Lumbar Spine - Transforaminal - left - L4-L5 - L5-S1.       3. Assessment Disc displacement, lumbar (M51.26).       4. Assessment Elevated blood-pressure reading, w/o diagnosis of htn (R03.0).         Pain Management Plan Pain Scale: 10/10. Method:  Numeric Pain Intensity Scale. Location: back. Onset: 11/09/2005. Duration: varies. Quality: discomforting. Pain management follow-up plan of care: Patient taking medication as prescribed.  Fall Risk Plan The patient has not fallen in the last year.              Provider:  Danae OrleansJoseph D. Venetia MaxonStern MD  10/31/2018 03:37 PM    Dictation edited by: Danae OrleansJoseph D. Venetia MaxonStern    CC Providers: Benedetto GoadFred  Wilson [facility] [address] [place],  [place]  [id number]-   Maeola HarmanJoseph Derren Suydam MD  [address] [place], [place] [id number]               Electronically signed by Danae OrleansJoseph D. Venetia MaxonStern MD on 10/31/2018 03:37 PM  Patient ID:   [id number] Patient: Cheyenne Brown  Date of Birth: 06/25/1960 Visit Type: Office Visit   Date: 10/04/2018 04:00 PM Provider: Danae OrleansJoseph D. Venetia MaxonStern MD   This 58 year old female presents for Leg pain.  HISTORY OF PRESENT ILLNESS:  1.  Leg pain  Patient visits after calling to report new left leg pain x2 months.  She notes tingling and numbness in both feet with sitting long periods. Patient reports difficulty squatting due to leg pain and weakness. She reports her pain is primarily in the left leg. Patient grades her pain at an 8/10.   Norco 10/325 1-2/day Gabapentin 300 mg t.i.d.         Medical/Surgical/Interim History Reviewed, no change.  Last detailed document date:03/30/2013.     Family History:  Reviewed, no changes.  Last detailed document date:03/30/2013.   Social History: Reviewed, no changes. Last detailed document date: 03/30/2013.    MEDICATIONS: (added, continued or stopped this visit) Started Medication Directions Instruction Stopped  12/26/2015 cyclobenzaprine 10 mg tablet take 1 tablet by oral route 3 times every day    09/01/2018 hydrocodone 10 mg-acetaminophen 325 mg tablet take 1 tablet by oral route  twice a day as needed for pain    05/04/2018 Neurontin 300 mg capsule take 1 capsule by oral route qHS x3, then BID x3days, then TID       ALLERGIES: Ingredient Reaction Medication Name Comment  NO KNOWN ALLERGIES     No known allergies.    PHYSICAL EXAM:   Vitals Date Temp F BP Pulse Ht In Wt Lb BMI BSA Pain Score  10/04/2018 97.7 131/86 88 66 119 19.21  8/10      IMPRESSION:   Upon examination, 4/5 EHL weakness on the left 4+ dorsiflexion weakness. Very positive SLR on the left. Normal strength on the right. Negative SLR on the right.   PLAN:  1) Lumbar MRI without contrast  2) Follow up to review  imaging   Orders: Instruction(s)/Education: Assessment Instruction  R03.0 Hypertension education   Completed Orders (this encounter) Order Details Reason Side Interpretation Result Initial Treatment Date Region  Hypertension education Continue to monitor blood pressure, if blood pressure remains elevated contact primary doctor         Assessment/Plan   # Detail Type Description   1. Assessment Radiculopathy, lumbar region (M54.16).       2. Assessment Elevated blood-pressure reading, w/o diagnosis of htn (R03.0).         Pain Management Plan Pain Scale: 8/10. Method: Numeric Pain Intensity Scale. Location: back. Onset: 11/09/2005. Duration: varies. Quality: discomforting. Pain management follow-up plan of care: Patient taking medication as prescribed.  Fall Risk Plan The patient has not fallen in the last year.  Provider:  Danae Orleans. Venetia Maxon MD  10/04/2018 05:05 PM    Dictation edited by: Bing Matter    CC Providers: Benedetto Goad [facility] [address]  [place]  [address]-   Maeola Harman MD  [address] [place], [place] [address]               Electronically signed by Danae Orleans. Venetia Maxon MD on 10/10/2018 02:58 PM

## 2018-12-07 NOTE — Pre-Procedure Instructions (Signed)
Cheyenne Brown  12/07/2018      [facility] - [place], [place] - [address] [place] [address] Phone: [phone] Fax: [phone]    Your procedure is scheduled on 12/09/18.  Report to [facility] Admitting at 1230 pm  Call this number if you have problems the morning of surgery:  [phone]   Remember:  Do not eat or drink after midnight.  You    Take these medicines the morning of surgery with A SIP OF WATER ----XANAX,NORVASC,NEURONTIN,HYDROCODONE,PRILOSEC    Do not wear jewelry, make-up or nail polish.  Do not wear lotions, powders, or perfumes, or deodorant.  Do not shave 48 hours prior to surgery.  Men may shave face and neck.  Do not bring valuables to the hospital.  [facility] is not responsible for any belongings or valuables.  Contacts, dentures or bridgework may not be worn into surgery.  Leave your suitcase in the car.  After surgery it may be brought to your room.  For patients admitted to the hospital, discharge time will be determined by your treatment team.  Patients discharged the day of surgery will not be allowed to drive home.    Special instructions:  Do not take any aspirin,anti-inflammatories,vitamins,or herbal supplements 5-7 days prior to surgery. Cheyenne Brown - Preparing for Surgery  Before surgery, you can play an important role.  Because skin is not sterile, your skin needs to be as free of germs as possible.  You can reduce the number of germs on you skin by washing with CHG (chlorahexidine gluconate) soap before surgery.  CHG is an antiseptic cleaner which kills germs and bonds with the skin to continue killing germs even after washing.  Oral Hygiene is also important in reducing the risk of infection.  Remember to brush your teeth with your regular toothpaste the morning of surgery.  Please DO NOT use if you have an allergy to CHG or antibacterial soaps.  If your skin becomes reddened/irritated stop  using the CHG and inform your nurse when you arrive at Short Stay.  Do not shave (including legs and underarms) for at least 48 hours prior to the first CHG shower.  You may shave your face.  Please follow these instructions carefully:   1.  Shower with CHG Soap the night before surgery and the morning of Surgery.  2.  If you choose to wash your hair, wash your hair first as usual with your normal shampoo.  3.  After you shampoo, rinse your hair and body thoroughly to remove the shampoo. 4.  Use CHG as you would any other liquid soap.  You can apply chg directly to the skin and wash gently with a      scrungie or washcloth.           5.  Apply the CHG Soap to your body ONLY FROM THE NECK DOWN.   Do not use on open wounds or open sores. Avoid contact with your eyes, ears, mouth and genitals (private parts).  Wash genitals (private parts) with your normal soap.  6.  Wash thoroughly, paying special attention to the area where your surgery will be performed.  7.  Thoroughly rinse your body with warm water from the neck down.  8.  DO NOT shower/wash with your normal soap after using and rinsing off the CHG Soap.  9.  Pat yourself dry with a clean towel.  10.  Wear clean pajamas.            11.  Place clean sheets on your bed the night of your first shower and do not sleep with pets.  Day of Surgery  Do not apply any lotions/deoderants the morning of surgery.   Please wear clean clothes to the hospital/surgery center. Remember to brush your teeth with toothpaste.   Please read over the following fact sheets that you were given. MRSA Information

## 2018-12-07 NOTE — Progress Notes (Signed)
PCP - Tillman Sers Cardiologist - NA      EKG - TODAY  ECHO - 3/12 -       P  Aspirin Instructions:STOP   -   COVID TEST- TODAY   Anesthesia review:  NA Patient denies shortness of breath, fever, cough and chest pain at PAT appointment   Patient verbalized understanding of instructions that were given to them at the PAT appointment. Patient was also instructed that they will need to review over the PAT instructions again at home before surgery.

## 2018-12-09 ENCOUNTER — Other Ambulatory Visit: Payer: Self-pay

## 2018-12-09 ENCOUNTER — Ambulatory Visit (HOSPITAL_COMMUNITY): Payer: PRIVATE HEALTH INSURANCE

## 2018-12-09 ENCOUNTER — Ambulatory Visit (HOSPITAL_COMMUNITY): Payer: PRIVATE HEALTH INSURANCE | Admitting: Anesthesiology

## 2018-12-09 ENCOUNTER — Encounter (HOSPITAL_COMMUNITY): Payer: Self-pay | Admitting: Anesthesiology

## 2018-12-09 ENCOUNTER — Encounter (HOSPITAL_COMMUNITY)
Admission: RE | Disposition: A | Discharge status: Home / Self Care | Payer: Self-pay | Source: Home / Self Care | Attending: Neurosurgery

## 2018-12-09 ENCOUNTER — Observation Stay (HOSPITAL_COMMUNITY)
Admission: RE | Admit: 2018-12-09 | Discharge: 2018-12-10 | Disposition: A | Discharge status: Home / Self Care | Payer: PRIVATE HEALTH INSURANCE | Attending: Neurosurgery | Admitting: Neurosurgery

## 2018-12-09 DIAGNOSIS — I10 Essential (primary) hypertension: Secondary | ICD-10-CM | POA: Insufficient documentation

## 2018-12-09 DIAGNOSIS — M5116 Intervertebral disc disorders with radiculopathy, lumbar region: Principal | ICD-10-CM | POA: Insufficient documentation

## 2018-12-09 DIAGNOSIS — Z79899 Other long term (current) drug therapy: Secondary | ICD-10-CM | POA: Diagnosis not present

## 2018-12-09 DIAGNOSIS — Z79891 Long term (current) use of opiate analgesic: Secondary | ICD-10-CM | POA: Insufficient documentation

## 2018-12-09 DIAGNOSIS — M5126 Other intervertebral disc displacement, lumbar region: Secondary | ICD-10-CM | POA: Diagnosis present

## 2018-12-09 DIAGNOSIS — F419 Anxiety disorder, unspecified: Secondary | ICD-10-CM | POA: Diagnosis not present

## 2018-12-09 DIAGNOSIS — M48061 Spinal stenosis, lumbar region without neurogenic claudication: Secondary | ICD-10-CM | POA: Diagnosis not present

## 2018-12-09 DIAGNOSIS — Z419 Encounter for procedure for purposes other than remedying health state, unspecified: Secondary | ICD-10-CM

## 2018-12-09 DIAGNOSIS — K219 Gastro-esophageal reflux disease without esophagitis: Secondary | ICD-10-CM | POA: Diagnosis not present

## 2018-12-09 DIAGNOSIS — Z87891 Personal history of nicotine dependence: Secondary | ICD-10-CM | POA: Diagnosis not present

## 2018-12-09 HISTORY — PX: LUMBAR LAMINECTOMY/DECOMPRESSION MICRODISCECTOMY: SHX5026

## 2018-12-09 SURGERY — LUMBAR LAMINECTOMY/DECOMPRESSION MICRODISCECTOMY 2 LEVELS
Anesthesia: General | Site: Spine Lumbar | Laterality: Left

## 2018-12-09 MED ORDER — PHENYLEPHRINE 40 MCG/ML (10ML) SYRINGE FOR IV PUSH (FOR BLOOD PRESSURE SUPPORT)
PREFILLED_SYRINGE | INTRAVENOUS | Status: DC | PRN
  Administered 2018-12-09 (×5): 80 ug via INTRAVENOUS

## 2018-12-09 MED ORDER — PROPOFOL 10 MG/ML IV BOLUS
INTRAVENOUS | Status: DC | PRN
  Administered 2018-12-09: 100 mg via INTRAVENOUS

## 2018-12-09 MED ORDER — IBUPROFEN 600 MG PO TABS
600.0000 mg | ORAL_TABLET | Freq: Four times a day (QID) | ORAL | Status: DC | PRN
  Filled 2018-12-09: qty 1

## 2018-12-09 MED ORDER — AMLODIPINE BESYLATE 5 MG PO TABS
5.0000 mg | ORAL_TABLET | Freq: Every day | ORAL | Status: DC
  Administered 2018-12-10: 10:00:00 5 mg via ORAL
  Filled 2018-12-09: qty 1

## 2018-12-09 MED ORDER — SODIUM CHLORIDE 0.9 % IV SOLN: 250.0000 mL | INTRAVENOUS | Status: DC

## 2018-12-09 MED ORDER — SODIUM CHLORIDE 0.9 % IV SOLN
INTRAVENOUS | Status: DC | PRN
  Administered 2018-12-09: 15:00:00 20 ug/min via INTRAVENOUS

## 2018-12-09 MED ORDER — BUPIVACAINE HCL (PF) 0.5 % IJ SOLN
INTRAMUSCULAR | Status: DC | PRN
  Administered 2018-12-09: 4 mL

## 2018-12-09 MED ORDER — THROMBIN 5000 UNITS EX SOLR
CUTANEOUS | Status: AC
  Filled 2018-12-09: qty 5000

## 2018-12-09 MED ORDER — ROCURONIUM BROMIDE 10 MG/ML (PF) SYRINGE
PREFILLED_SYRINGE | INTRAVENOUS | Status: AC
  Filled 2018-12-09: qty 10

## 2018-12-09 MED ORDER — PROPOFOL 10 MG/ML IV BOLUS
INTRAVENOUS | Status: AC
  Filled 2018-12-09: qty 20

## 2018-12-09 MED ORDER — FENTANYL CITRATE (PF) 250 MCG/5ML IJ SOLN
INTRAMUSCULAR | Status: DC | PRN
  Administered 2018-12-09: 150 ug via INTRAVENOUS
  Administered 2018-12-09 (×2): 50 ug via INTRAVENOUS

## 2018-12-09 MED ORDER — SUGAMMADEX SODIUM 200 MG/2ML IV SOLN
INTRAVENOUS | Status: DC | PRN
  Administered 2018-12-09: 200 mg via INTRAVENOUS

## 2018-12-09 MED ORDER — PHENOL 1.4 % MT LIQD: 1.0000 | OROMUCOSAL | Status: DC | PRN

## 2018-12-09 MED ORDER — FENTANYL CITRATE (PF) 100 MCG/2ML IJ SOLN
INTRAMUSCULAR | Status: DC | PRN
  Administered 2018-12-09: 100 ug via INTRAVENOUS

## 2018-12-09 MED ORDER — MEPERIDINE HCL 25 MG/ML IJ SOLN: 6.2500 mg | INTRAMUSCULAR | Status: DC | PRN

## 2018-12-09 MED ORDER — POLYETHYLENE GLYCOL 3350 17 G PO PACK: 17.0000 g | PACK | Freq: Every day | ORAL | Status: DC | PRN

## 2018-12-09 MED ORDER — PROMETHAZINE HCL 25 MG/ML IJ SOLN: 6.2500 mg | INTRAMUSCULAR | Status: DC | PRN

## 2018-12-09 MED ORDER — LIDOCAINE 2% (20 MG/ML) 5 ML SYRINGE
INTRAMUSCULAR | Status: DC | PRN
  Administered 2018-12-09: 40 mg via INTRAVENOUS

## 2018-12-09 MED ORDER — SCOPOLAMINE 1 MG/3DAYS TD PT72
MEDICATED_PATCH | TRANSDERMAL | Status: AC
  Administered 2018-12-09: 13:00:00 1.5 mg via TRANSDERMAL
  Filled 2018-12-09: qty 1

## 2018-12-09 MED ORDER — THROMBIN 5000 UNITS EX SOLR
OROMUCOSAL | Status: DC | PRN
  Administered 2018-12-09: 5 mL

## 2018-12-09 MED ORDER — METHYLPREDNISOLONE ACETATE 80 MG/ML IJ SUSP
INTRAMUSCULAR | Status: AC
  Filled 2018-12-09: qty 1

## 2018-12-09 MED ORDER — GABAPENTIN 300 MG PO CAPS
300.0000 mg | ORAL_CAPSULE | Freq: Three times a day (TID) | ORAL | Status: DC
  Administered 2018-12-09 – 2018-12-10 (×3): 300 mg via ORAL
  Filled 2018-12-09 (×3): qty 1

## 2018-12-09 MED ORDER — CEFAZOLIN SODIUM-DEXTROSE 2-4 GM/100ML-% IV SOLN
INTRAVENOUS | Status: AC
  Filled 2018-12-09: qty 100

## 2018-12-09 MED ORDER — DOCUSATE SODIUM 100 MG PO CAPS
100.0000 mg | ORAL_CAPSULE | Freq: Two times a day (BID) | ORAL | Status: DC
  Administered 2018-12-09 – 2018-12-10 (×2): 100 mg via ORAL
  Filled 2018-12-09 (×2): qty 1

## 2018-12-09 MED ORDER — ONDANSETRON HCL 4 MG/2ML IJ SOLN
INTRAMUSCULAR | Status: AC
  Filled 2018-12-09: qty 2

## 2018-12-09 MED ORDER — ALUM & MAG HYDROXIDE-SIMETH 200-200-20 MG/5ML PO SUSP: 30.0000 mL | Freq: Four times a day (QID) | ORAL | Status: DC | PRN

## 2018-12-09 MED ORDER — NALOXONE HCL 0.4 MG/ML IJ SOLN
INTRAMUSCULAR | Status: DC | PRN
  Administered 2018-12-09: 80 ug via INTRAVENOUS

## 2018-12-09 MED ORDER — HYDROMORPHONE HCL 1 MG/ML IJ SOLN
INTRAMUSCULAR | Status: AC
  Filled 2018-12-09: qty 1

## 2018-12-09 MED ORDER — ACETAMINOPHEN 650 MG RE SUPP: 650.0000 mg | RECTAL | Status: DC | PRN

## 2018-12-09 MED ORDER — CYCLOBENZAPRINE HCL 10 MG PO TABS: 10.0000 mg | ORAL_TABLET | Freq: Three times a day (TID) | ORAL | Status: DC | PRN

## 2018-12-09 MED ORDER — ZOLPIDEM TARTRATE 5 MG PO TABS: 5.0000 mg | ORAL_TABLET | Freq: Every evening | ORAL | Status: DC | PRN

## 2018-12-09 MED ORDER — FENTANYL CITRATE (PF) 250 MCG/5ML IJ SOLN
INTRAMUSCULAR | Status: AC
  Filled 2018-12-09: qty 5

## 2018-12-09 MED ORDER — ONDANSETRON HCL 4 MG/2ML IJ SOLN: 4.0000 mg | Freq: Four times a day (QID) | INTRAMUSCULAR | Status: DC | PRN

## 2018-12-09 MED ORDER — MIDAZOLAM HCL 5 MG/5ML IJ SOLN
INTRAMUSCULAR | Status: DC | PRN
  Administered 2018-12-09: 2 mg via INTRAVENOUS

## 2018-12-09 MED ORDER — MORPHINE SULFATE (PF) 2 MG/ML IV SOLN: 2.0000 mg | INTRAVENOUS | Status: DC | PRN

## 2018-12-09 MED ORDER — HYDROCODONE-ACETAMINOPHEN 7.5-325 MG PO TABS
2.0000 | ORAL_TABLET | ORAL | Status: DC | PRN
  Administered 2018-12-10 (×2): 2 via ORAL
  Filled 2018-12-09 (×2): qty 2

## 2018-12-09 MED ORDER — KETOROLAC TROMETHAMINE 30 MG/ML IJ SOLN
INTRAMUSCULAR | Status: DC | PRN
  Administered 2018-12-09: 30 mg via INTRAVENOUS

## 2018-12-09 MED ORDER — DEXAMETHASONE SODIUM PHOSPHATE 10 MG/ML IJ SOLN
INTRAMUSCULAR | Status: DC | PRN
  Administered 2018-12-09: 4 mg via INTRAVENOUS

## 2018-12-09 MED ORDER — ACETAMINOPHEN 325 MG PO TABS: 650.0000 mg | ORAL_TABLET | ORAL | Status: DC | PRN

## 2018-12-09 MED ORDER — KCL IN DEXTROSE-NACL 20-5-0.45 MEQ/L-%-% IV SOLN: INTRAVENOUS | Status: DC

## 2018-12-09 MED ORDER — SODIUM CHLORIDE 0.9% FLUSH
3.0000 mL | Freq: Two times a day (BID) | INTRAVENOUS | Status: DC
  Administered 2018-12-09: 21:00:00 3 mL via INTRAVENOUS

## 2018-12-09 MED ORDER — BISACODYL 10 MG RE SUPP: 10.0000 mg | Freq: Every day | RECTAL | Status: DC | PRN

## 2018-12-09 MED ORDER — CEFAZOLIN SODIUM-DEXTROSE 2-4 GM/100ML-% IV SOLN
2.0000 g | INTRAVENOUS | Status: AC
  Administered 2018-12-09: 15:00:00 2 g via INTRAVENOUS

## 2018-12-09 MED ORDER — PANTOPRAZOLE SODIUM 40 MG IV SOLR: 40.0000 mg | Freq: Every day | INTRAVENOUS | Status: DC

## 2018-12-09 MED ORDER — BUPIVACAINE HCL (PF) 0.5 % IJ SOLN
INTRAMUSCULAR | Status: AC
  Filled 2018-12-09: qty 30

## 2018-12-09 MED ORDER — ALBUMIN HUMAN 5 % IV SOLN
INTRAVENOUS | Status: DC | PRN
  Administered 2018-12-09: 16:00:00 via INTRAVENOUS

## 2018-12-09 MED ORDER — MENTHOL 3 MG MT LOZG: 1.0000 | LOZENGE | OROMUCOSAL | Status: DC | PRN

## 2018-12-09 MED ORDER — METHOCARBAMOL 500 MG PO TABS
500.0000 mg | ORAL_TABLET | Freq: Four times a day (QID) | ORAL | Status: DC | PRN
  Administered 2018-12-09 – 2018-12-10 (×2): 500 mg via ORAL
  Filled 2018-12-09 (×2): qty 1

## 2018-12-09 MED ORDER — DEXAMETHASONE SODIUM PHOSPHATE 10 MG/ML IJ SOLN
INTRAMUSCULAR | Status: AC
  Filled 2018-12-09: qty 1

## 2018-12-09 MED ORDER — ALPRAZOLAM 0.5 MG PO TABS
1.0000 mg | ORAL_TABLET | Freq: Three times a day (TID) | ORAL | Status: DC | PRN
  Administered 2018-12-09: 21:00:00 1 mg via ORAL
  Filled 2018-12-09: qty 2

## 2018-12-09 MED ORDER — LINACLOTIDE 145 MCG PO CAPS
290.0000 ug | ORAL_CAPSULE | ORAL | Status: DC
  Filled 2018-12-09: qty 2

## 2018-12-09 MED ORDER — LIDOCAINE-EPINEPHRINE 1 %-1:100000 IJ SOLN
INTRAMUSCULAR | Status: AC
  Filled 2018-12-09: qty 1

## 2018-12-09 MED ORDER — ONDANSETRON HCL 4 MG/2ML IJ SOLN
INTRAMUSCULAR | Status: DC | PRN
  Administered 2018-12-09: 4 mg via INTRAVENOUS

## 2018-12-09 MED ORDER — CEFAZOLIN SODIUM-DEXTROSE 2-4 GM/100ML-% IV SOLN
2.0000 g | Freq: Three times a day (TID) | INTRAVENOUS | Status: AC
  Administered 2018-12-09 – 2018-12-10 (×2): 2 g via INTRAVENOUS
  Filled 2018-12-09 (×2): qty 100

## 2018-12-09 MED ORDER — FLEET ENEMA 7-19 GM/118ML RE ENEM: 1.0000 | ENEMA | Freq: Once | RECTAL | Status: DC | PRN

## 2018-12-09 MED ORDER — MIDAZOLAM HCL 2 MG/2ML IJ SOLN
INTRAMUSCULAR | Status: AC
  Filled 2018-12-09: qty 2

## 2018-12-09 MED ORDER — FENTANYL CITRATE (PF) 100 MCG/2ML IJ SOLN
INTRAMUSCULAR | Status: AC
  Filled 2018-12-09: qty 2

## 2018-12-09 MED ORDER — HYDROMORPHONE HCL 1 MG/ML IJ SOLN
0.2500 mg | INTRAMUSCULAR | Status: DC | PRN
  Administered 2018-12-09 (×2): 0.5 mg via INTRAVENOUS

## 2018-12-09 MED ORDER — METHYLPREDNISOLONE ACETATE 80 MG/ML IJ SUSP
INTRAMUSCULAR | Status: DC | PRN
  Administered 2018-12-09: 80 mg

## 2018-12-09 MED ORDER — SCOPOLAMINE 1 MG/3DAYS TD PT72
1.0000 | MEDICATED_PATCH | Freq: Once | TRANSDERMAL | Status: DC
  Administered 2018-12-09: 13:00:00 1.5 mg via TRANSDERMAL

## 2018-12-09 MED ORDER — LACTATED RINGERS IV SOLN
INTRAVENOUS | Status: DC
  Administered 2018-12-09 (×2): via INTRAVENOUS

## 2018-12-09 MED ORDER — NALOXONE HCL 0.4 MG/ML IJ SOLN
INTRAMUSCULAR | Status: AC
  Filled 2018-12-09: qty 1

## 2018-12-09 MED ORDER — LIDOCAINE 2% (20 MG/ML) 5 ML SYRINGE
INTRAMUSCULAR | Status: AC
  Filled 2018-12-09: qty 5

## 2018-12-09 MED ORDER — LIDOCAINE-EPINEPHRINE 1 %-1:100000 IJ SOLN
INTRAMUSCULAR | Status: DC | PRN
  Administered 2018-12-09: 4 mL

## 2018-12-09 MED ORDER — KETOROLAC TROMETHAMINE 30 MG/ML IJ SOLN
INTRAMUSCULAR | Status: AC
  Filled 2018-12-09: qty 1

## 2018-12-09 MED ORDER — PANTOPRAZOLE SODIUM 40 MG PO TBEC
40.0000 mg | DELAYED_RELEASE_TABLET | Freq: Every day | ORAL | Status: DC
  Administered 2018-12-10: 10:00:00 40 mg via ORAL
  Filled 2018-12-09: qty 1

## 2018-12-09 MED ORDER — MIDAZOLAM HCL 2 MG/2ML IJ SOLN: 0.5000 mg | Freq: Once | INTRAMUSCULAR | Status: DC | PRN

## 2018-12-09 MED ORDER — 0.9 % SODIUM CHLORIDE (POUR BTL) OPTIME
TOPICAL | Status: DC | PRN
  Administered 2018-12-09: 1000 mL

## 2018-12-09 MED ORDER — HYDROCODONE-ACETAMINOPHEN 10-325 MG PO TABS
1.0000 | ORAL_TABLET | Freq: Two times a day (BID) | ORAL | Status: DC | PRN
  Administered 2018-12-09: 21:00:00 1 via ORAL
  Filled 2018-12-09: qty 1

## 2018-12-09 MED ORDER — SODIUM CHLORIDE 0.9% FLUSH: 3.0000 mL | INTRAVENOUS | Status: DC | PRN

## 2018-12-09 MED ORDER — OXYCODONE HCL 5 MG PO TABS
5.0000 mg | ORAL_TABLET | ORAL | Status: DC | PRN
  Administered 2018-12-09: 19:00:00 5 mg via ORAL
  Filled 2018-12-09: qty 1

## 2018-12-09 MED ORDER — ONDANSETRON HCL 4 MG PO TABS: 4.0000 mg | ORAL_TABLET | Freq: Four times a day (QID) | ORAL | Status: DC | PRN

## 2018-12-09 MED ORDER — METHOCARBAMOL 1000 MG/10ML IJ SOLN
500.0000 mg | Freq: Four times a day (QID) | INTRAVENOUS | Status: DC | PRN
  Filled 2018-12-09: qty 5

## 2018-12-09 MED ORDER — ROCURONIUM BROMIDE 10 MG/ML (PF) SYRINGE
PREFILLED_SYRINGE | INTRAVENOUS | Status: DC | PRN
  Administered 2018-12-09: 20 mg via INTRAVENOUS
  Administered 2018-12-09: 50 mg via INTRAVENOUS

## 2018-12-09 SURGICAL SUPPLY — 62 items
ADH SKN CLS APL DERMABOND .7 (GAUZE/BANDAGES/DRESSINGS) ×1
APL SKNCLS STERI-STRIP NONHPOA (GAUZE/BANDAGES/DRESSINGS) ×1
BENZOIN TINCTURE PRP APPL 2/3 (GAUZE/BANDAGES/DRESSINGS) ×1 IMPLANT
BLADE CLIPPER SURG (BLADE) IMPLANT
BUR MATCHSTICK NEURO 3.0 LAGG (BURR) ×2 IMPLANT
BUR ROUND FLUTED 5 RND (BURR) ×2 IMPLANT
CANISTER SUCT 3000ML PPV (MISCELLANEOUS) ×2 IMPLANT
CARTRIDGE OIL MAESTRO DRILL (MISCELLANEOUS) ×1 IMPLANT
COVER WAND RF STERILE (DRAPES) ×2 IMPLANT
DECANTER SPIKE VIAL GLASS SM (MISCELLANEOUS) ×2 IMPLANT
DERMABOND ADVANCED (GAUZE/BANDAGES/DRESSINGS) ×1
DERMABOND ADVANCED .7 DNX12 (GAUZE/BANDAGES/DRESSINGS) ×1 IMPLANT
DIFFUSER DRILL AIR PNEUMATIC (MISCELLANEOUS) ×2 IMPLANT
DRAPE LAPAROTOMY 100X72X124 (DRAPES) ×2 IMPLANT
DRAPE MICROSCOPE LEICA (MISCELLANEOUS) ×2 IMPLANT
DRAPE SURG 17X23 STRL (DRAPES) ×2 IMPLANT
DRSG OPSITE POSTOP 3X4 (GAUZE/BANDAGES/DRESSINGS) ×1 IMPLANT
DRSG OPSITE POSTOP 4X8 (GAUZE/BANDAGES/DRESSINGS) ×1 IMPLANT
DURAPREP 26ML APPLICATOR (WOUND CARE) ×2 IMPLANT
ELECT REM PT RETURN 9FT ADLT (ELECTROSURGICAL) ×2
ELECTRODE REM PT RTRN 9FT ADLT (ELECTROSURGICAL) ×1 IMPLANT
GAUZE 4X4 16PLY RFD (DISPOSABLE) IMPLANT
GAUZE SPONGE 4X4 12PLY STRL (GAUZE/BANDAGES/DRESSINGS) IMPLANT
GLOVE BIO SURGEON STRL SZ7 (GLOVE) ×1 IMPLANT
GLOVE BIO SURGEON STRL SZ8 (GLOVE) ×2 IMPLANT
GLOVE BIOGEL PI IND STRL 8 (GLOVE) ×1 IMPLANT
GLOVE BIOGEL PI IND STRL 8.5 (GLOVE) ×1 IMPLANT
GLOVE BIOGEL PI INDICATOR 8 (GLOVE) ×1
GLOVE BIOGEL PI INDICATOR 8.5 (GLOVE) ×1
GLOVE ECLIPSE 8.0 STRL XLNG CF (GLOVE) ×2 IMPLANT
GLOVE EXAM NITRILE XL STR (GLOVE) IMPLANT
GOWN STRL REUS W/ TWL LRG LVL3 (GOWN DISPOSABLE) IMPLANT
GOWN STRL REUS W/ TWL XL LVL3 (GOWN DISPOSABLE) ×1 IMPLANT
GOWN STRL REUS W/TWL 2XL LVL3 (GOWN DISPOSABLE) ×2 IMPLANT
GOWN STRL REUS W/TWL LRG LVL3 (GOWN DISPOSABLE)
GOWN STRL REUS W/TWL XL LVL3 (GOWN DISPOSABLE) ×2
HEMOSTAT POWDER KIT SURGIFOAM (HEMOSTASIS) ×2 IMPLANT
KIT BASIN OR (CUSTOM PROCEDURE TRAY) ×2 IMPLANT
KIT TURNOVER KIT B (KITS) ×2 IMPLANT
NDL HYPO 18GX1.5 BLUNT FILL (NEEDLE) IMPLANT
NDL HYPO 25X1 1.5 SAFETY (NEEDLE) ×1 IMPLANT
NDL SPNL 18GX3.5 QUINCKE PK (NEEDLE) ×1 IMPLANT
NEEDLE HYPO 18GX1.5 BLUNT FILL (NEEDLE) ×2 IMPLANT
NEEDLE HYPO 25X1 1.5 SAFETY (NEEDLE) ×2 IMPLANT
NEEDLE SPNL 18GX3.5 QUINCKE PK (NEEDLE) ×2 IMPLANT
NS IRRIG 1000ML POUR BTL (IV SOLUTION) ×2 IMPLANT
OIL CARTRIDGE MAESTRO DRILL (MISCELLANEOUS) ×2
PACK LAMINECTOMY NEURO (CUSTOM PROCEDURE TRAY) ×2 IMPLANT
PAD ARMBOARD 7.5X6 YLW CONV (MISCELLANEOUS) ×6 IMPLANT
RUBBERBAND STERILE (MISCELLANEOUS) ×4 IMPLANT
SPONGE SURGIFOAM ABS GEL SZ50 (HEMOSTASIS) IMPLANT
STAPLER SKIN PROX WIDE 3.9 (STAPLE) IMPLANT
STRIP CLOSURE SKIN 1/2X4 (GAUZE/BANDAGES/DRESSINGS) ×1 IMPLANT
SUT VIC AB 0 CT1 18XCR BRD8 (SUTURE) ×1 IMPLANT
SUT VIC AB 0 CT1 8-18 (SUTURE) ×2
SUT VIC AB 2-0 CT1 18 (SUTURE) ×2 IMPLANT
SUT VIC AB 3-0 SH 8-18 (SUTURE) ×2 IMPLANT
SYR 3ML LL SCALE MARK (SYRINGE) ×1 IMPLANT
SYR 5ML LL (SYRINGE) IMPLANT
TOWEL GREEN STERILE (TOWEL DISPOSABLE) ×2 IMPLANT
TOWEL GREEN STERILE FF (TOWEL DISPOSABLE) ×2 IMPLANT
WATER STERILE IRR 1000ML POUR (IV SOLUTION) ×2 IMPLANT

## 2018-12-09 NOTE — Anesthesia Preprocedure Evaluation (Addendum)
Anesthesia Evaluation  Patient identified by MRN, date of birth, ID band Patient awake    Reviewed: Allergy & Precautions, NPO status , Patient's Chart, lab work & pertinent test results  History of Anesthesia Complications Negative for: history of anesthetic complications  Airway Mallampati: II  TM Distance: >3 FB Neck ROM: Full    Dental  (+) Partial Lower, Dental Advisory Given   Pulmonary former smoker (quit 2009),  12/07/2018 SARS coronavirus NEG   breath sounds clear to auscultation       Cardiovascular hypertension, Pt. on medications (-) angina Rhythm:Regular Rate:Normal  '12 ECHO: EF 60% to 65%. Wall motion was normal; grade 1 diastolic dysfunction, Mild AI, Trivial MR, trivial TR   Neuro/Psych S/p craniotomy for hematoma    GI/Hepatic Neg liver ROS, GERD  Medicated and Controlled,  Endo/Other  negative endocrine ROS  Renal/GU      Musculoskeletal   Abdominal (+) - obese,   Peds  Hematology negative hematology ROS (+)   Anesthesia Other Findings   Reproductive/Obstetrics                            Anesthesia Physical Anesthesia Plan  ASA: II  Anesthesia Plan: General   Post-op Pain Management:    Induction: Intravenous  PONV Risk Score and Plan: 4 or greater and Scopolamine patch - Pre-op, Dexamethasone and Ondansetron  Airway Management Planned: Oral ETT  Additional Equipment:   Intra-op Plan:   Post-operative Plan: Extubation in OR  Informed Consent: I have reviewed the patients History and Physical, chart, labs and discussed the procedure including the risks, benefits and alternatives for the proposed anesthesia with the patient or authorized representative who has indicated his/her understanding and acceptance.     Dental advisory given  Plan Discussed with: CRNA and Surgeon  Anesthesia Plan Comments:        Anesthesia Quick Evaluation

## 2018-12-09 NOTE — Interval H&P Note (Signed)
History and Physical Interval Note:  12/09/2018 2:44 PM  Cheyenne Brown  has presented today for surgery, with the diagnosis of Disc displacement, Lumbar.  The various methods of treatment have been discussed with the patient and family. After consideration of risks, benefits and other options for treatment, the patient has consented to  Procedure(s) with comments: Left Lumbar 4-5 Lumbar 5-Sacral 1 Microdiscectomy (Left) - Left Lumbar 4-5 Lumbar 5-Sacral 1 Microdiscectomy as a surgical intervention.  The patient's history has been reviewed, patient examined, no change in status, stable for surgery.  I have reviewed the patient's chart and labs.  Questions were answered to the patient's satisfaction.     Peggyann Shoals

## 2018-12-09 NOTE — Anesthesia Postprocedure Evaluation (Signed)
Anesthesia Post Note  Patient: Cheyenne Brown  Procedure(s) Performed: Left Lumbar Four-Five Lumbar Five-Sacral One Microdiscectomy (Left Spine Lumbar)     Patient location during evaluation: PACU Anesthesia Type: General Level of consciousness: awake and alert, patient cooperative and oriented Pain control: pain improving. Vital Signs Assessment: post-procedure vital signs reviewed and stable Respiratory status: spontaneous breathing, nonlabored ventilation, respiratory function stable and patient connected to nasal cannula oxygen Cardiovascular status: blood pressure returned to baseline and stable Postop Assessment: no apparent nausea or vomiting Anesthetic complications: no    Last Vitals:  Vitals:   12/09/18 1730 12/09/18 1750  BP:  (!) 129/98  Pulse: 75 78  Resp: 12 18  Temp:  36.7 C  SpO2: 100%     Last Pain:  Vitals:   12/09/18 1234  TempSrc:   PainSc: 7                  Kassidy Dockendorf,E. Mikaya Bunner

## 2018-12-09 NOTE — Op Note (Signed)
12/09/2018  4:13 PM  PATIENT:  Cheyenne Brown  58 y.o. 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, stenosis, radiculopathy, lumbago  PROCEDURE:  Procedure(s) with comments: Left Lumbar Four-Five Lumbar Five-Sacral One Microdiscectomy (Left) - Left Lumbar Four-Five Lumbar Five-Sacral One Microdiscectomy  SURGEON:  Surgeon(s) and Role:    Maeola Harman, MD - Primary  PHYSICIAN ASSISTANT:   ASSISTANTS: Poteat, RN   ANESTHESIA:   general  EBL:  50 mL   BLOOD ADMINISTERED:none  DRAINS: none   LOCAL MEDICATIONS USED:  MARCAINE    and LIDOCAINE   SPECIMEN:  No Specimen  DISPOSITION OF SPECIMEN:  N/A  COUNTS:  YES  TOURNIQUET:  * No tourniquets in log *   DICTATION: Patient has a  disc rupture on the left with significant left leg weakness. Botht the left L 45 and L 5 S 1 levels are affected. It was elected to take her to surgery for left L 45 and L 5 S 1  microdiscectomy.  Procedure: Patient was brought to the operating room and following the smooth and uncomplicated induction of general endotracheal anesthesia she was placed in a prone position on the Wilson frame. Low back was prepped and draped in the usual sterile fashion with betadine scrub and DuraPrep.  Area of planned incision was infiltrated with local lidocaine. Incision was made in the midline and carried to the lumbodorsal fascia which was incised on the left side of midline. Subperiosteal dissection was performed exposing what was felt to be L 45 and L 5 S 1  levels. Intraoperative x-ray demonstrated marker probe at L 45 and L 5 S 1.  A hemi-semi-laminectomy of L 4 was performed a high-speed drill and completed with Kerrison rongeurs and a generous foraminotomy was performed overlying the superior aspect of the L 5 lamina. Ligamentum flavum was detached and removed in a  piecemeal fashion and the L 4 nerve root was decompressed laterally with removal of the superior aspect of the facet and ligamentum causing nerve root compression. The microscope was brought into the field and the L 4 nerve root was mobilized medially. A hemi-semi-laminectomy of L 5 was performed a high-speed drill and completed with Kerrison rongeurs and a generous foraminotomy was performed overlying the superior aspect of the S 1 lamina. Ligamentum flavum was detached and removed in a piecemeal fashion and the S 1 nerve root was decompressed laterally with removal of the superior aspect of the facet and ligamentum causing nerve root compression. The microscope was brought into the field and the S 1 nerve root was mobilized medially.This exposed a knuckle of soft disc material directly beneath the takeoff of the nerve root. . Multiple fragments were removed and these extended into the interspace. As a result it was elected to further decompress the interspace and remove loose disc material and this was done with a variety of pituitary rongeurs. At the L 45 level, lateral recess stenosis was removed and the lateral aspect of the thecal sac was decompressed, as was the L 5 nerve root with a foraminotomy.   At this point it was felt that all neural elements were well decompressed and there was no evidence of residual loose disc material within the interspace. Hemostasis was assured with bipolar electrocautery and Surgifoam and the interspace was irrigated with Depo-Medrol and fentanyl. The lumbodorsal fascia  was closed with 0 Vicryl sutures the subcutaneous tissues reapproximated 2-0 Vicryl inverted sutures and the skin edges were reapproximated with 3-0 Vicryl subcuticular stitch. The wound is dressed with Dermabond and an occlusive dressing. Patient was extubated in the operating room and taken to recovery in stable and satisfactory condition having tolerated her operation well counts were correct at the end of the  case.   PLAN OF CARE: Admit for overnight observation  PATIENT DISPOSITION:  PACU - hemodynamically stable.   Delay start of Pharmacological VTE agent (>24hrs) due to surgical blood loss or risk of bleeding: yes

## 2018-12-09 NOTE — Transfer of Care (Signed)
Immediate Anesthesia Transfer of Care Note  Patient: Cheyenne Brown  Procedure(s) Performed: Left Lumbar Four-Five Lumbar Five-Sacral One Microdiscectomy (Left Spine Lumbar)  Patient Location: PACU  Anesthesia Type:General  Level of Consciousness: awake, drowsy and patient cooperative  Airway & Oxygen Therapy: Patient Spontanous Breathing and Patient connected to face mask oxygen  Post-op Assessment: Report given to RN and Post -op Vital signs reviewed and stable  Post vital signs: Reviewed and stable  Last Vitals:  Vitals Value Taken Time  BP 139/78 12/09/18 1626  Temp 36.4 C 12/09/18 1626  Pulse 80 12/09/18 1631  Resp 13 12/09/18 1631  SpO2 100 % 12/09/18 1631  Vitals shown include unvalidated device data.  Last Pain:  Vitals:   12/09/18 1234  TempSrc:   PainSc: 7       Patients Stated Pain Goal: 3 (17/79/39 0300)  Complications: No apparent anesthesia complications

## 2018-12-09 NOTE — Brief Op Note (Signed)
12/09/2018  4:13 PM  PATIENT:  Cheyenne Brown  58 y.o. 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, stenosis, radiculopathy, lumbago  PROCEDURE:  Procedure(s) with comments: Left Lumbar Four-Five Lumbar Five-Sacral One Microdiscectomy (Left) - Left Lumbar Four-Five Lumbar Five-Sacral One Microdiscectomy  SURGEON:  Surgeon(s) and Role:    * Jakeem Grape, MD - Primary  PHYSICIAN ASSISTANT:   ASSISTANTS: Poteat, RN   ANESTHESIA:   general  EBL:  50 mL   BLOOD ADMINISTERED:none  DRAINS: none   LOCAL MEDICATIONS USED:  MARCAINE    and LIDOCAINE   SPECIMEN:  No Specimen  DISPOSITION OF SPECIMEN:  N/A  COUNTS:  YES  TOURNIQUET:  * No tourniquets in log *   DICTATION: Patient has a  disc rupture on the left with significant left leg weakness. Botht the left L 45 and L 5 S 1 levels are affected. It was elected to take her to surgery for left L 45 and L 5 S 1  microdiscectomy.  Procedure: Patient was brought to the operating room and following the smooth and uncomplicated induction of general endotracheal anesthesia she was placed in a prone position on the Wilson frame. Low back was prepped and draped in the usual sterile fashion with betadine scrub and DuraPrep.  Area of planned incision was infiltrated with local lidocaine. Incision was made in the midline and carried to the lumbodorsal fascia which was incised on the left side of midline. Subperiosteal dissection was performed exposing what was felt to be L 45 and L 5 S 1  levels. Intraoperative x-ray demonstrated marker probe at L 45 and L 5 S 1.  A hemi-semi-laminectomy of L 4 was performed a high-speed drill and completed with Kerrison rongeurs and a generous foraminotomy was performed overlying the superior aspect of the L 5 lamina. Ligamentum flavum was detached and removed in a  piecemeal fashion and the L 4 nerve root was decompressed laterally with removal of the superior aspect of the facet and ligamentum causing nerve root compression. The microscope was brought into the field and the L 4 nerve root was mobilized medially. A hemi-semi-laminectomy of L 5 was performed a high-speed drill and completed with Kerrison rongeurs and a generous foraminotomy was performed overlying the superior aspect of the S 1 lamina. Ligamentum flavum was detached and removed in a piecemeal fashion and the S 1 nerve root was decompressed laterally with removal of the superior aspect of the facet and ligamentum causing nerve root compression. The microscope was brought into the field and the S 1 nerve root was mobilized medially.This exposed a knuckle of soft disc material directly beneath the takeoff of the nerve root. . Multiple fragments were removed and these extended into the interspace. As a result it was elected to further decompress the interspace and remove loose disc material and this was done with a variety of pituitary rongeurs. At the L 45 level, lateral recess stenosis was removed and the lateral aspect of the thecal sac was decompressed, as was the L 5 nerve root with a foraminotomy.   At this point it was felt that all neural elements were well decompressed and there was no evidence of residual loose disc material within the interspace. Hemostasis was assured with bipolar electrocautery and Surgifoam and the interspace was irrigated with Depo-Medrol and fentanyl. The lumbodorsal fascia   was closed with 0 Vicryl sutures the subcutaneous tissues reapproximated 2-0 Vicryl inverted sutures and the skin edges were reapproximated with 3-0 Vicryl subcuticular stitch. The wound is dressed with Dermabond and an occlusive dressing. Patient was extubated in the operating room and taken to recovery in stable and satisfactory condition having tolerated her operation well counts were correct at the end of the  case.   PLAN OF CARE: Admit for overnight observation  PATIENT DISPOSITION:  PACU - hemodynamically stable.   Delay start of Pharmacological VTE agent (>24hrs) due to surgical blood loss or risk of bleeding: yes

## 2018-12-09 NOTE — Anesthesia Procedure Notes (Signed)
Procedure Name: Intubation Date/Time: 12/09/2018 2:43 PM Performed by: Renato Shin, CRNA Pre-anesthesia Checklist: Patient identified, Emergency Drugs available, Suction available and Patient being monitored Patient Re-evaluated:Patient Re-evaluated prior to induction Oxygen Delivery Method: Circle system utilized Preoxygenation: Pre-oxygenation with 100% oxygen Induction Type: IV induction Ventilation: Mask ventilation without difficulty Laryngoscope Size: Miller and 2 Grade View: Grade I Tube type: Oral Tube size: 7.0 mm Number of attempts: 1 Airway Equipment and Method: Stylet and Oral airway Placement Confirmation: ETT inserted through vocal cords under direct vision,  positive ETCO2 and breath sounds checked- equal and bilateral Secured at: 21 cm Tube secured with: Tape Dental Injury: Teeth and Oropharynx as per pre-operative assessment

## 2018-12-10 ENCOUNTER — Encounter (HOSPITAL_COMMUNITY): Payer: Self-pay | Admitting: Neurosurgery

## 2018-12-10 DIAGNOSIS — M5116 Intervertebral disc disorders with radiculopathy, lumbar region: Secondary | ICD-10-CM | POA: Diagnosis not present

## 2018-12-10 MED ORDER — METHOCARBAMOL 500 MG PO TABS: 500.0000 mg | ORAL_TABLET | Freq: Four times a day (QID) | ORAL | 1 refills | Status: DC | PRN

## 2018-12-10 MED ORDER — HYDROCODONE-ACETAMINOPHEN 10-325 MG PO TABS: 1.0000 | ORAL_TABLET | ORAL | 0 refills | Status: DC | PRN

## 2018-12-10 MED ORDER — HYDROCODONE-ACETAMINOPHEN 10-325 MG PO TABS: 1.0000 | ORAL_TABLET | ORAL | Status: DC | PRN

## 2018-12-10 NOTE — Discharge Instructions (Signed)

## 2018-12-10 NOTE — Discharge Summary (Signed)
Physician Discharge Summary  Patient ID: CERA RORKE MRN: [id number] DOB/AGE: Jul 17, 1960 58 y.o.  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 Following uncomplicated surgery(above) she has mobilized well with resolution of leg pain.   Consults: None  Significant Diagnostic Studies: radiology: X-Ray: intra-op  Treatments: surgery: Left Lumbar Four-Five Lumbar Five-Sacral One Microdiscectomy (Left) - Left Lumbar Four-Five Lumbar Five-Sacral One Microdiscectomy    Discharge Exam: Blood pressure 140/85, pulse 76, temperature 97.6 F (36.4 C), temperature source Oral, resp. rate 18, height 5' 5.5" (1.664 m), weight 55.3 kg, SpO2 100 %. Alert, conversant, reporting resolution of leg pain. Good strength BLE. Incision flat without erythema, swelling, or drainage beneath honeycomb and Dermabond. She reports mild lumbar discomfort with position changes.    Disposition:  Discharge to home. She has f/u appt scheduled. Rx's for Norco 10/325 & Robaxin 500mg  will be eRx'ed for prn home use. She verbalizes understanding of d/c instructions.     Allergies as of 12/10/2018   No Known Allergies     Medication List    TAKE these medications   ALPRAZolam 1 MG tablet Commonly known as: XANAX Take 1 mg by mouth 3 (three) times daily as needed for anxiety.   amLODipine 5 MG tablet Commonly known as: NORVASC Take 5 mg by mouth daily.    Flexeril 10 MG tablet Generic drug: cyclobenzaprine Take 10 mg by mouth 3 (three) times daily as needed for muscle spasms.   gabapentin 300 MG capsule Commonly known as: NEURONTIN Take 300 mg by mouth 3 (three) times daily.   HYDROcodone-acetaminophen 10-325 MG tablet Commonly known as: NORCO Take 1 tablet by mouth 2 (two) times daily as needed for moderate pain. What changed: Another medication with the same name was added. Make sure you understand how and when to take each.   HYDROcodone-acetaminophen 10-325 MG tablet Commonly known as: NORCO Take 1 tablet by mouth every 4 (four) hours as needed for moderate pain. What changed: You were already taking a medication with the same name, and this prescription was added. Make sure you understand how and when to take each.   ibuprofen 200 MG tablet Commonly known as: ADVIL Take 600 mg by mouth every 6 (six) hours as needed for headache or moderate pain.   linaclotide 290 MCG Caps capsule Commonly known as: Linzess Take 1 capsule (290 mcg total) by mouth daily. On empty stomach. What changed: when to take this   methocarbamol 500 MG tablet Commonly known as: ROBAXIN Take 1 tablet (500 mg total) by mouth every 6 (six) hours as needed for muscle spasms.   omeprazole 20 MG capsule Commonly known as: PRILOSEC TAKE ONE CAPSULE BY MOUTH ONCE DAILY.        Signed: Peggyann Shoals, MD 12/10/2018, 7:56 AM

## 2018-12-10 NOTE — Evaluation (Signed)
Physical Therapy Evaluation & Discharge Patient Details Name: Cheyenne Brown MRN: [id number] DOB: [birth year] Today's Date: 12/10/2018   History of Present Illness  Pt is a 58 yo female s/p Left L4-5, L5-S1 miscrodiscectomy. PMHx:HTN.  Clinical Impression  Pt presented seated OOB in recliner chair, awake and willing to participate in therapy session. Prior to admission, pt reported that she ambulated with use of a cane. Pt lives with her husband in a single level home with two steps to enter. She will have assistance available intermittently upon d/c home. At the time of evaluation, pt overall at min guard to supervision level for all mobility including stair training. PT provided pt education re: back precautions, car transfers and generalized walking program for pt to initiate upon d/c home. No further acute PT needs identified at this time. PT signing off.     Follow Up Recommendations No PT follow up    Equipment Recommendations  None recommended by PT    Recommendations for Other Services       Precautions / Restrictions Precautions Precautions: Back Precaution Booklet Issued: Yes (comment) Precaution Comments: reviewed 3/3 back precautions with pt throughout Required Braces or Orthoses: ("No Brace Needed" MD orders) Restrictions Weight Bearing Restrictions: No      Mobility  Bed Mobility Overal bed mobility: Needs Assistance Bed Mobility: Rolling;Sidelying to Sit Rolling: Supervision Sidelying to sit: Supervision       General bed mobility comments: pt OOB in recliner chair upon arrival  Transfers Overall transfer level: Needs assistance Equipment used: None Transfers: Sit to/from Stand Sit to Stand: Supervision         General transfer comment: for safety; good technique utilized  Ambulation/Gait Ambulation/Gait assistance: Min Emergency planning/management officer (Feet): 400 Feet Assistive device: None Gait Pattern/deviations: Step-through pattern;Decreased  stride length Gait velocity: decreased   General Gait Details: pt with slow, cautious and guarded gait with very short step length bilaterally; minor LOB x1 which she was able to self correct  Stairs Stairs: Yes Stairs assistance: Min guard Stair Management: One rail Right;Step to pattern;Forwards Number of Stairs: 3 General stair comments: no LOB or need for physical assistance, min guard for safety  Wheelchair Mobility    Modified Rankin (Stroke Patients Only)       Balance Overall balance assessment: Needs assistance Sitting-balance support: Feet supported Sitting balance-Leahy Scale: Good     Standing balance support: No upper extremity supported Standing balance-Leahy Scale: Fair                               Pertinent Vitals/Pain Pain Assessment: 0-10 Pain Score: 5  Pain Location: back Pain Descriptors / Indicators: Discomfort Pain Intervention(s): Monitored during session;Repositioned    Home Living Family/patient expects to be discharged to:: Private residence Living Arrangements: Spouse/significant other Available Help at Discharge: Family;Available PRN/intermittently Type of Home: House Home Access: Stairs to enter Entrance Stairs-Rails: None Entrance Stairs-Number of Steps: 2 Home Layout: One level Home Equipment: Shower seat;Bedside commode;Cane - single point      Prior Function Level of Independence: Independent with assistive device(s)         Comments: Pt used SPC for mobility.      Hand Dominance   Dominant Hand: Right    Extremity/Trunk Assessment   Upper Extremity Assessment Upper Extremity Assessment: Defer to OT evaluation;Overall [facility] for tasks assessed    Lower Extremity Assessment Lower Extremity Assessment: Overall WFL for tasks assessed  Cervical / Trunk Assessment Cervical / Trunk Assessment: Other exceptions Cervical / Trunk Exceptions: s/p lumbar sx; kyphotic  Communication   Communication: No  difficulties  Cognition Arousal/Alertness: Awake/alert Behavior During Therapy: WFL for tasks assessed/performed Overall Cognitive Status: Within Functional Limits for tasks assessed                                        General Comments General comments (skin integrity, edema, etc.): Pt reports her brother in law lives next door so he can assist as needed when her spouse goes back to work    Exercises     Assessment/Plan    PT Assessment Patent does not need any further PT services  PT Problem List         PT Treatment Interventions      PT Goals (Current goals can be found in the Care Plan section)  Acute Rehab PT Goals Patient Stated Goal: "home today" PT Goal Formulation: All assessment and education complete, DC therapy    Frequency     Barriers to discharge        Co-evaluation               AM-PAC PT "6 Clicks" Mobility  Outcome Measure Help needed turning from your back to your side while in a flat bed without using bedrails?: None Help needed moving from lying on your back to sitting on the side of a flat bed without using bedrails?: None Help needed moving to and from a bed to a chair (including a wheelchair)?: None Help needed standing up from a chair using your arms (e.g., wheelchair or bedside chair)?: None Help needed to walk in hospital room?: None Help needed climbing 3-5 steps with a railing? : None 6 Click Score: 24    End of Session   Activity Tolerance: Patient tolerated treatment well Patient left: with call bell/phone within reach Nurse Communication: Mobility status PT Visit Diagnosis: Other abnormalities of gait and mobility (R26.89)    Time: 5009-3818 PT Time Calculation (min) (ACUTE ONLY): 27 min   Charges:   PT Evaluation $PT Eval Low Complexity: 1 Low PT Treatments $Gait Training: 8-22 mins        Sherie Don, PT, DPT  Acute Rehabilitation Services Pager [phone] Office  [place] 12/10/2018, 9:37 AM

## 2018-12-10 NOTE — Plan of Care (Signed)
Patient alert and oriented, mae's well, voiding adequate amount of urine, swallowing without difficulty, no c/o pain at time of discharge. Patient discharged home with family. Script and discharged instructions given to patient. Patient and family stated understanding of instructions given. Patient has an appointment with Dr.Stern    

## 2018-12-10 NOTE — Progress Notes (Addendum)
Subjective: Patient reports "I'm doing great"  Objective: Vital signs in last 24 hours: Temp:  [97.6 F (36.4 C)-98.2 F (36.8 C)] 97.6 F (36.4 C) (10/02 0402) Pulse Rate:  [70-89] 76 (10/02 0402) Resp:  [8-24] 18 (10/02 0402) BP: (105-158)/(68-98) 140/85 (10/02 0402) SpO2:  [98 %-100 %] 100 % (10/02 0402) Weight:  [55.3 kg] 55.3 kg (10/01 1225)  Intake/Output from previous day: 10/01 0701 - 10/02 0700 In: 1550 [I.V.:1300; IV Piggyback:250] Out: 50 [Blood:50] Intake/Output this shift: No intake/output data recorded.  Alert, conversant, reporting resolution of leg pain. Good strength BLE. Incision flat without erythema, swelling, or drainage beneath honeycomb and Dermabond. She reports mild lumbar discomfort with position changes.   Lab Results: Recent Labs    12/07/18 1353  WBC 11.0*  HGB 11.8*  HCT 36.0  PLT 167   BMET Recent Labs    12/07/18 1353  NA 138  K 3.7  CL 101  CO2 27  GLUCOSE 96  BUN <5*  CREATININE 0.56  CALCIUM 8.8*    Studies/Results: Dg Lumbar Spine 1 View  Result Date: 12/09/2018 CLINICAL DATA:  L4-L5-L5-S1 micro discectomy. EXAM: LUMBAR SPINE - 1 VIEW COMPARISON:  Preoperative lumbar spine MRI 10/20/2018 FINDINGS: Single portable cross-table lateral view obtained in the operating room. There is surgical instruments localizing posterior to L5-S1. IMPRESSION: Single lateral view with surgical instruments localizing posterior to L5-S1. Electronically Signed   By: Keith Rake M.D.   On: 12/09/2018 19:32    Assessment/Plan: improving  LOS: 0 days  Per DrStern, d/c IV, d/c to home. She has f/u appt scheduled. Rx's for Norco 10/325 & Robaxin 500mg  will be eRx'ed for prn home use. She verbalizes understanding of d/c instructions.   Verdis Prime 12/10/2018, 7:46 AM  Patient is doing well.  Discharge home.

## 2018-12-10 NOTE — Evaluation (Signed)
Occupational Therapy Evaluation Patient Details Name: Cheyenne Brown MRN: [id number] DOB: [birth year] Today's Date: 12/10/2018    History of Present Illness Pt is a 58 yo female s/p Left L4-5, L5-S1 miscrodiscectomy. PMHx:HTN.   Clinical Impression   Pt PTA: living with spouse and has supportive family. Pt was using SPC for mobility. Pt performing ADL functional mobility in room with supervisionA. Pt performing LB Dressing with figure 4 technique with minimal cueing for back precautions to avoid bending, but pt able to self correct after cue. Pt education on LB AE. Pt reporting to have reacher at home. Pt simulating tub transfer in room with stepping to the side technique with supervisionA. Pt does not require continued OT skilled services. OT signing off.    Follow Up Recommendations  No OT follow up    Equipment Recommendations  None recommended by OT    Recommendations for Other Services       Precautions / Restrictions Precautions Precautions: Back Precaution Booklet Issued: Yes (comment) Precaution Comments: verbally discussed precautions Restrictions Weight Bearing Restrictions: No      Mobility Bed Mobility Overal bed mobility: Needs Assistance Bed Mobility: Rolling;Sidelying to Sit Rolling: Supervision Sidelying to sit: Supervision       General bed mobility comments: supervisionA for log roll technique  Transfers Overall transfer level: Needs assistance   Transfers: Sit to/from Stand Sit to Stand: Supervision         General transfer comment: for safety    Balance Overall balance assessment: Mild deficits observed, not formally tested                                         ADL either performed or assessed with clinical judgement   ADL Overall ADL's : At baseline                                       General ADL Comments: Pt performing LB Dressing with figure 4 technique with minimal cueing for back precautions  to avoid bending, but pt able to self correct after cue.      Vision Baseline Vision/History: Wears glasses Wears Glasses: Reading only Patient Visual Report: No change from baseline Vision Assessment?: No apparent visual deficits     Perception     Praxis      Pertinent Vitals/Pain Pain Assessment: 0-10 Pain Score: 5  Pain Location: L Leg Pain Descriptors / Indicators: Discomfort Pain Intervention(s): Monitored during session     Hand Dominance Right   Extremity/Trunk Assessment Upper Extremity Assessment Upper Extremity Assessment: Overall WFL for tasks assessed   Lower Extremity Assessment Lower Extremity Assessment: Generalized weakness   Cervical / Trunk Assessment Cervical / Trunk Assessment: Other exceptions Cervical / Trunk Exceptions: s/p back sx   Communication Communication Communication: No difficulties   Cognition Arousal/Alertness: Awake/alert Behavior During Therapy: WFL for tasks assessed/performed Overall Cognitive Status: Within Functional Limits for tasks assessed                                     General Comments  Pt reports her brother in law lives next door so he can assist as needed when her spouse goes back to work    Exercises  Shoulder Instructions      Home Living Family/patient expects to be discharged to:: Private residence Living Arrangements: Spouse/significant other Available Help at Discharge: Family;Available PRN/intermittently Type of Home: House Home Access: Stairs to enter [employer] of Steps: 2 Entrance Stairs-Rails: None Home Layout: One level     Bathroom Shower/Tub: Chief Strategy Officer: Standard     Home Equipment: Shower seat;Bedside commode;Cane - single point          Prior Functioning/Environment Level of Independence: Independent with assistive device(s)        Comments: Pt used SPC for mobility.         OT Problem List: Impaired balance (sitting  and/or standing)      OT Treatment/Interventions:      OT Goals(Current goals can be found in the care plan section) Acute Rehab OT Goals Patient Stated Goal: to go home OT Goal Formulation: With patient  OT Frequency:     Barriers to D/C:            Co-evaluation              AM-PAC OT "6 Clicks" Daily Activity     Outcome Measure Help from another person eating meals?: None Help from another person taking care of personal grooming?: None Help from another person toileting, which includes using toliet, bedpan, or urinal?: None Help from another person bathing (including washing, rinsing, drying)?: A Little Help from another person to put on and taking off regular upper body clothing?: None Help from another person to put on and taking off regular lower body clothing?: None 6 Click Score: 23   End of Session Nurse Communication: Mobility status  Activity Tolerance: Patient tolerated treatment well Patient left: in chair;with call bell/phone within reach  OT Visit Diagnosis: Unsteadiness on feet (R26.81);Muscle weakness (generalized) (M62.81)                Time: 2637-8588 OT Time Calculation (min): 25 min Charges:  OT General Charges $OT Visit: 1 Visit OT Evaluation $OT Eval Moderate Complexity: 1 Mod OT Treatments $Self Care/Home Management : 8-22 mins  Revonda Standard Cecil Cranker) Glendell Docker OTR/L Acute Rehabilitation Services Pager: [phone] Office: [phone]   Lonzo Cloud 12/10/2018, 7:58 AM

## 2019-01-14 ENCOUNTER — Encounter (HOSPITAL_COMMUNITY): Payer: Self-pay | Admitting: *Deleted

## 2019-01-14 ENCOUNTER — Emergency Department (HOSPITAL_COMMUNITY): Payer: PRIVATE HEALTH INSURANCE

## 2019-01-14 ENCOUNTER — Other Ambulatory Visit: Payer: Self-pay

## 2019-01-14 ENCOUNTER — Emergency Department (HOSPITAL_COMMUNITY)
Admission: EM | Admit: 2019-01-14 | Discharge: 2019-01-14 | Disposition: A | Discharge status: Home / Self Care | Payer: PRIVATE HEALTH INSURANCE | Attending: Emergency Medicine | Admitting: Emergency Medicine

## 2019-01-14 DIAGNOSIS — M542 Cervicalgia: Secondary | ICD-10-CM

## 2019-01-14 DIAGNOSIS — Z87891 Personal history of nicotine dependence: Secondary | ICD-10-CM | POA: Insufficient documentation

## 2019-01-14 DIAGNOSIS — R519 Headache, unspecified: Secondary | ICD-10-CM | POA: Diagnosis not present

## 2019-01-14 DIAGNOSIS — Z79899 Other long term (current) drug therapy: Secondary | ICD-10-CM | POA: Diagnosis not present

## 2019-01-14 DIAGNOSIS — I10 Essential (primary) hypertension: Secondary | ICD-10-CM | POA: Diagnosis not present

## 2019-01-14 LAB — CBC WITH DIFFERENTIAL/PLATELET
Abs Immature Granulocytes: 0.01 10*3/uL (ref 0.00–0.07)
Basophils Absolute: 0 10*3/uL (ref 0.0–0.1)
Basophils Relative: 0 %
Eosinophils Absolute: 0.1 10*3/uL (ref 0.0–0.5)
Eosinophils Relative: 1 %
HCT: 39 % (ref 36.0–46.0)
Hemoglobin: 12.4 g/dL (ref 12.0–15.0)
Immature Granulocytes: 0 %
Lymphocytes Relative: 33 %
Lymphs Abs: 1.6 10*3/uL (ref 0.7–4.0)
MCH: 29.7 pg (ref 26.0–34.0)
MCHC: 31.8 g/dL (ref 30.0–36.0)
MCV: 93.5 fL (ref 80.0–100.0)
Monocytes Absolute: 0.4 10*3/uL (ref 0.1–1.0)
Monocytes Relative: 8 %
Neutro Abs: 2.6 10*3/uL (ref 1.7–7.7)
Neutrophils Relative %: 58 %
Platelets: 185 10*3/uL (ref 150–400)
RBC: 4.17 MIL/uL (ref 3.87–5.11)
RDW: 12 % (ref 11.5–15.5)
WBC: 4.7 10*3/uL (ref 4.0–10.5)
nRBC: 0 % (ref 0.0–0.2)

## 2019-01-14 LAB — COMPREHENSIVE METABOLIC PANEL
ALT: 12 U/L (ref 0–44)
AST: 17 U/L (ref 15–41)
Albumin: 4.2 g/dL (ref 3.5–5.0)
Alkaline Phosphatase: 63 U/L (ref 38–126)
Anion gap: 11 (ref 5–15)
BUN: 7 mg/dL (ref 6–20)
CO2: 24 mmol/L (ref 22–32)
Calcium: 9.3 mg/dL (ref 8.9–10.3)
Chloride: 105 mmol/L (ref 98–111)
Creatinine, Ser: 0.61 mg/dL (ref 0.44–1.00)
GFR calc Af Amer: >60 mL/min (ref >60)
GFR calc non Af Amer: >60 mL/min (ref >60)
Glucose, Bld: 64 mg/dL — ABNORMAL LOW (ref 70–99)
Potassium: 3.1 mmol/L — ABNORMAL LOW (ref 3.5–5.1)
Sodium: 140 mmol/L (ref 135–145)
Total Bilirubin: 1 mg/dL (ref 0.3–1.2)
Total Protein: 7.3 g/dL (ref 6.5–8.1)

## 2019-01-14 MED ORDER — OXYCODONE-ACETAMINOPHEN 5-325 MG PO TABS
1.0000 | ORAL_TABLET | ORAL | Status: DC | PRN
  Administered 2019-01-14: 1 via ORAL
  Filled 2019-01-14: qty 1

## 2019-01-14 MED ORDER — DROPERIDOL 2.5 MG/ML IJ SOLN
2.5000 mg | Freq: Once | INTRAMUSCULAR | Status: AC
  Administered 2019-01-14: 20:00:00 2.5 mg via INTRAVENOUS
  Filled 2019-01-14: qty 2

## 2019-01-14 MED ORDER — KETOROLAC TROMETHAMINE 15 MG/ML IJ SOLN
15.0000 mg | Freq: Once | INTRAMUSCULAR | Status: AC
  Administered 2019-01-14: 22:00:00 15 mg via INTRAVENOUS
  Filled 2019-01-14: qty 1

## 2019-01-14 MED ORDER — DEXAMETHASONE SODIUM PHOSPHATE 10 MG/ML IJ SOLN
10.0000 mg | Freq: Once | INTRAMUSCULAR | Status: AC
  Administered 2019-01-14: 10 mg via INTRAVENOUS
  Filled 2019-01-14: qty 1

## 2019-01-14 MED ORDER — POTASSIUM CHLORIDE CRYS ER 20 MEQ PO TBCR
40.0000 meq | EXTENDED_RELEASE_TABLET | Freq: Once | ORAL | Status: AC
  Administered 2019-01-14: 21:00:00 40 meq via ORAL
  Filled 2019-01-14: qty 2

## 2019-01-14 MED ORDER — SODIUM CHLORIDE 0.9 % IV BOLUS
1000.0000 mL | Freq: Once | INTRAVENOUS | Status: AC
  Administered 2019-01-14: 19:00:00 1000 mL via INTRAVENOUS

## 2019-01-14 MED ORDER — LIDOCAINE 5 % EX PTCH
1.0000 | MEDICATED_PATCH | CUTANEOUS | Status: DC
  Administered 2019-01-14: 20:00:00 1 via TRANSDERMAL
  Filled 2019-01-14: qty 1

## 2019-01-14 NOTE — ED Triage Notes (Signed)
Pt states frontal headache and post neck pain r/t previous surgeries.  Afebrile.  Called her pcp who ordered migraine meds with no relief.

## 2019-01-14 NOTE — ED Provider Notes (Signed)
[facility] EMERGENCY DEPARTMENT Provider Note   CSN: [id number] Arrival date & time: 01/14/19  1037     History   Chief Complaint Chief Complaint  Patient presents with   Neck Pain    HPI Cheyenne Brown is a 58 y.o. female.     The history is provided by the patient and medical records. No language interpreter was used.  Neck Pain  Cheyenne Brown is a 58 y.o. female who presents to the Emergency Department complaining of headache and neck pain. She presents to the emergency department complaining of two days of severe posterior headache and neck pain. She has a history of multiple prior spine surgeries. Pain is worse with sitting up and better with laying supine. She reports poor appetite and unable to eat due to pain was sitting up. She denies any numbness, weakness. She has been taking multiple over-the-counter medications including Advil, Aleve and goodie powders. She took a large amount of these on Wednesday and had vomiting secondary to taking the medications. She is not taking his many over-the-counter medicines currently. She does have hydrocodone 10 mg and has been taking two tablets daily. She has no improvement in her symptoms with this medication. Her last final surgery was in October and was in her lower back. She has a history of hypertension, no additional medical problems. She has no sore throat or pain with swallowing. She denies any fevers, abdominal pain, chest pain, shortness of breath. No known COVID19 exposures.  Past Medical History:  Diagnosis Date   Anemia    presumed related to metromenorrhagia   Constipation    GERD (gastroesophageal reflux disease)    Head trauma 1996   depressed skull fracture and epidural hematoma following below with a blunt object; surgery and prolonged rehabilitation required   History of tobacco abuse     15 pack years; discontinued 2008   Hypertension    Menometrorrhagia    PUD (peptic ulcer  disease)    per pt report, likely secondary to [facility] powders    Patient Active Problem List   Diagnosis Date Noted   Herniated lumbar disc without myelopathy 12/09/2018   Constipation 12/20/2014   Dysphagia 10/24/2010   Special screening for malignant neoplasms, colon 10/24/2010   Chest pain, atypical 10/16/2010   Syncope 05/29/2010   History of tobacco abuse 01/23/2006   GERD 01/23/2006   HEAD TRAUMA, HX OF 01/23/2006    Past Surgical History:  Procedure Laterality Date   COLONOSCOPY  10/30/2010   RMR: Anal papilla, otherwise normal rectum and colon (melanosis coli)   crainiotomy  1996   ESOPHAGOGASTRODUODENOSCOPY  10/30/2010   RMR: Normal tubular esophagus. Staus post of maloney dilation as described above. . Normal stomach, duodenum through the second portion   LUMBAR LAMINECTOMY/DECOMPRESSION MICRODISCECTOMY Left 12/09/2018   Procedure: Left Lumbar Four-Five Lumbar Five-Sacral One Microdiscectomy;  Surgeon: Maeola Harman, MD;  Location: [facility] OR;  Service: Neurosurgery;  Laterality: Left;  Left Lumbar Four-Five Lumbar Five-Sacral One Microdiscectomy   MALONEY DILATION  10/30/2010   Procedure: MALONEY DILATION;  Surgeon: Corbin Ade, MD;  Location: [facility] ENDO SUITE;  Service: Endoscopy;  Laterality: N/A;   NECK SURGERY  2008 & 2011   x2     OB History   No obstetric history on file.      Home Medications    Prior to Admission medications   Medication Sig Start Date End Date Taking? Authorizing Provider  ALPRAZolam Prudy Feeler) 1 MG tablet Take 1  mg by mouth 3 (three) times daily as needed for anxiety.     [provider]  amLODipine (NORVASC) 5 MG tablet Take 5 mg by mouth daily.    [provider]  cyclobenzaprine (FLEXERIL) 10 MG tablet Take 10 mg by mouth 3 (three) times daily as needed for muscle spasms.     [provider]  gabapentin (NEURONTIN) 300 MG capsule Take 300 mg by mouth 3 (three) times daily.    [provider]    HYDROcodone-acetaminophen (NORCO) 10-325 MG tablet Take 1 tablet by mouth 2 (two) times daily as needed for moderate pain.    [provider]  HYDROcodone-acetaminophen (NORCO) 10-325 MG tablet Take 1 tablet by mouth every 4 (four) hours as needed for moderate pain. 12/10/18   Erline Levine, MD  ibuprofen (ADVIL) 200 MG tablet Take 600 mg by mouth every 6 (six) hours as needed for headache or moderate pain.    [provider]  Linaclotide Rolan Lipa) 290 MCG CAPS capsule Take 1 capsule (290 mcg total) by mouth daily. On empty stomach. Patient taking differently: Take 290 mcg by mouth every other day. On empty stomach. 03/07/15   Carlis Stable, NP  methocarbamol (ROBAXIN) 500 MG tablet Take 1 tablet (500 mg total) by mouth every 6 (six) hours as needed for muscle spasms. 12/10/18   Erline Levine, MD  omeprazole (PRILOSEC) 20 MG capsule TAKE ONE CAPSULE BY MOUTH ONCE DAILY. Patient taking differently: Take 20 mg by mouth daily.  03/14/15   Mahala Menghini, PA-C    Family History Family History  Problem Relation Age of Onset   Coronary artery disease Mother        CABG   Ulcers Father    Coronary artery disease Brother        CABG-2011   Coronary artery disease Sister        PCI   Colon cancer Neg Hx     Social History Social History   Tobacco Use   Smoking status: Former Smoker    Packs/day: 0.50    Years: 25.00    Pack years: 12.50    Types: Cigarettes    Quit date: 05/29/2007    Years since quitting: 11.6   Smokeless tobacco: Never Used  Substance Use Topics   Alcohol use: No   Drug use: No     Allergies   Patient has no known allergies.   Review of Systems Review of Systems  Musculoskeletal: Positive for neck pain.  All other systems reviewed and are negative.    Physical Exam Updated Vital Signs BP 136/90 (BP Location: Left Arm)    Pulse 81    Temp 97.8 F (36.6 C) (Oral)    Resp 17    SpO2 100%   Physical Exam Vitals signs and nursing  note reviewed.  Constitutional:      Appearance: She is well-developed.  HENT:     Head: Normocephalic and atraumatic.  Cardiovascular:     Rate and Rhythm: Normal rate and regular rhythm.     Heart sounds: No murmur.  Pulmonary:     Effort: Pulmonary effort is normal. No respiratory distress.     Breath sounds: Normal breath sounds.  Abdominal:     Palpations: Abdomen is soft.     Tenderness: There is no abdominal tenderness. There is no guarding or rebound.  Musculoskeletal:        General: No tenderness.     Comments: 2+ radial and DP pulses  bilaterally  Skin:    General: Skin is warm and dry.  Neurological:     Mental Status: She is alert and oriented to person, place, and time.     Comments: Five out of five strength in all four extremities with sensation to light touch intact in all four extremities  Psychiatric:        Behavior: Behavior normal.      ED Treatments / Results  Labs (all labs ordered are listed, but only abnormal results are displayed) Labs Reviewed  COMPREHENSIVE METABOLIC PANEL  CBC WITH DIFFERENTIAL/PLATELET    EKG None  Radiology Ct Head Wo Contrast  Result Date: 01/14/2019 CLINICAL DATA:  Frontal headache, posterior neck pain, status post neck surgery EXAM: CT HEAD WITHOUT CONTRAST CT CERVICAL SPINE WITHOUT CONTRAST TECHNIQUE: Multidetector CT imaging of the head and cervical spine was performed following the standard protocol without intravenous contrast. Multiplanar CT image reconstructions of the cervical spine were also generated. COMPARISON:  None. FINDINGS: CT HEAD FINDINGS Brain: No evidence of acute infarction, hemorrhage, hydrocephalus, extra-axial collection or mass lesion/mass effect. 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 Vascular: No hyperdense vessel or unexpected calcification. Skull: Status post right frontoparietal craniotomy. Negative for fracture or focal lesion. Sinuses/Orbits: No acute finding. Other: None.  CT CERVICAL SPINE FINDINGS Alignment: Normal. Skull base and vertebrae: No acute fracture. No primary bone lesion or focal pathologic process. Soft tissues and spinal canal: No prevertebral fluid or swelling. No visible canal hematoma. Disc levels: Status post anterior cervical discectomy and fusion of C4 through C7. Mild disc space height loss and osteophytosis of the remaining anatomic levels. Upper chest: Negative. Other: None. IMPRESSION: 1. No acute intracranial pathology. 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 2. No fracture or static subluxation of the cervical spine. Status post anterior cervical discectomy and fusion of C4 through C7. Mild disc space height loss and osteophytosis of the remaining anatomic levels. Electronically Signed   By: Lauralyn Primes M.D.   On: 01/14/2019 16:58   Ct Cervical Spine Wo Contrast  Result Date: 01/14/2019 CLINICAL DATA:  Frontal headache, posterior neck pain, status post neck surgery EXAM: CT HEAD WITHOUT CONTRAST CT CERVICAL SPINE WITHOUT CONTRAST TECHNIQUE: Multidetector CT imaging of the head and cervical spine was performed following the standard protocol without intravenous contrast. Multiplanar CT image reconstructions of the cervical spine were also generated. COMPARISON:  None. FINDINGS: CT HEAD FINDINGS Brain: No evidence of acute infarction, hemorrhage, hydrocephalus, extra-axial collection or mass lesion/mass effect. 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 Vascular: No hyperdense vessel or unexpected calcification. Skull: Status post right frontoparietal craniotomy. Negative for fracture or focal lesion. Sinuses/Orbits: No acute finding. Other: None. CT CERVICAL SPINE FINDINGS Alignment: Normal. Skull base and vertebrae: No acute fracture. No primary bone lesion or focal pathologic process. Soft tissues and spinal canal: No prevertebral fluid or swelling. No visible canal  hematoma. Disc levels: Status post anterior cervical discectomy and fusion of C4 through C7. Mild disc space height loss and osteophytosis of the remaining anatomic levels. Upper chest: Negative. Other: None. IMPRESSION: 1. No acute intracranial pathology. 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 2. No fracture or static subluxation of the cervical spine. Status post anterior cervical discectomy and fusion of C4 through C7. Mild disc space height loss and osteophytosis of the remaining anatomic levels. Electronically Signed   By: Erasmo Score.D.  On: 01/14/2019 16:58    Procedures Procedures (including critical care time)  Medications Ordered in ED Medications  oxyCODONE-acetaminophen (PERCOCET/ROXICET) 5-325 MG per tablet 1 tablet (1 tablet Oral Given 01/14/19 1107)  sodium chloride 0.9 % bolus 1,000 mL (has no administration in time range)  lidocaine (LIDODERM) 5 % 1 patch (has no administration in time range)     Initial Impression / Assessment and Plan / ED Course  I have reviewed the triage vital signs and the nursing notes.  Pertinent labs & imaging results that were available during my care of the patient were reviewed by me and considered in my medical decision making (see chart for details).        Patient here for evaluation of headache, neck pain. She is non-toxic appearing on evaluation with no focal neurologic deficits. Presentation is not consistent with subarachnoid hemorrhage, meningitis, dural sinus thrombosis, RPA. Following treatment in the emergency department she is feeling significantly improved. Counseled patient on home care for neck pain, headache. Discussed outpatient follow-up and return precautions.  Final Clinical Impressions(s) / ED Diagnoses   Final diagnoses:  Bad headache  Neck pain    ED Discharge Orders    None       Tilden Fossaees, Kiesha Ensey, MD 01/14/19 2358

## 2019-01-14 NOTE — ED Notes (Signed)
Pt given diet coke. 

## 2019-09-30 ENCOUNTER — Other Ambulatory Visit: Payer: Self-pay | Admitting: Neurosurgery

## 2019-09-30 ENCOUNTER — Other Ambulatory Visit: Payer: Self-pay | Admitting: Pediatric Cardiology

## 2019-09-30 ENCOUNTER — Other Ambulatory Visit (HOSPITAL_COMMUNITY): Payer: Self-pay | Admitting: Neurosurgery

## 2019-09-30 DIAGNOSIS — M5412 Radiculopathy, cervical region: Secondary | ICD-10-CM

## 2019-09-30 DIAGNOSIS — M5416 Radiculopathy, lumbar region: Secondary | ICD-10-CM

## 2019-10-24 ENCOUNTER — Ambulatory Visit (HOSPITAL_COMMUNITY): Admission: RE | Admit: 2019-10-24 | Payer: PRIVATE HEALTH INSURANCE | Source: Ambulatory Visit

## 2019-10-24 ENCOUNTER — Ambulatory Visit (HOSPITAL_COMMUNITY): Payer: PRIVATE HEALTH INSURANCE

## 2019-10-24 ENCOUNTER — Encounter (HOSPITAL_COMMUNITY): Payer: Self-pay

## 2019-11-29 ENCOUNTER — Other Ambulatory Visit: Payer: Self-pay | Admitting: Neurosurgery

## 2019-12-15 IMAGING — CT CT CERVICAL SPINE W/O CM
4 of 8 series · 12 of 33 positions shown, 13 images · non-contrast
Comparison: None.

CLINICAL DATA: Frontal headache, posterior neck pain, status post
neck surgery

EXAM:
CT HEAD WITHOUT CONTRAST
CT CERVICAL SPINE WITHOUT CONTRAST
TECHNIQUE: Multidetector CT imaging of the head and cervical spine was
performed following the standard protocol without intravenous
contrast. Multiplanar CT image reconstructions of the cervical spine
were also generated.

[Series 4: c spine soft (person_name) · axial · 0.29mm/px · z∈[-266,-186]mm · 3 of 81 slices shown]
[im 21/81  soft-tissue]
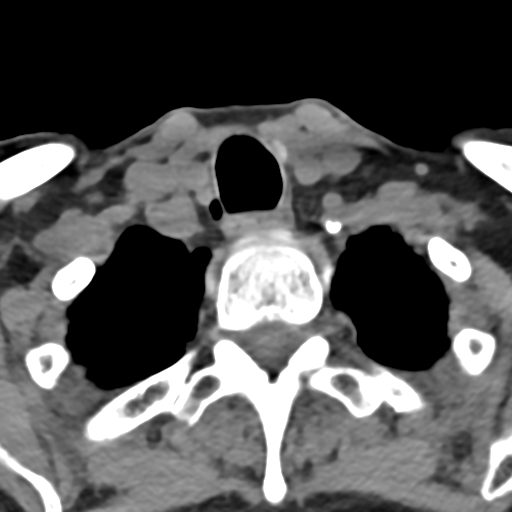
[im 41/81  soft-tissue]
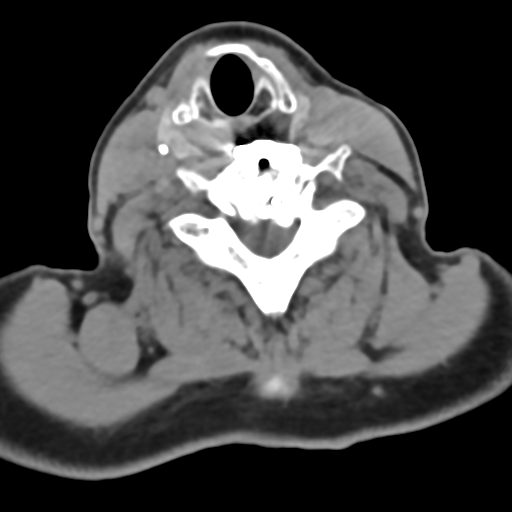
[im 61/81  soft-tissue]
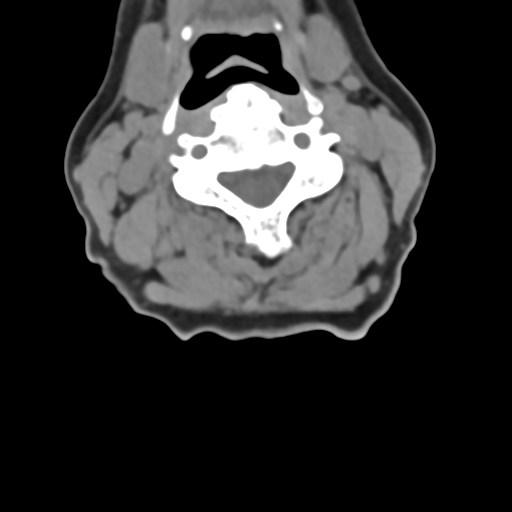

[Series 7: sag bone · sagittal · 0.28mm/px · 5 of 82 slices shown]
[im 14/82  bone]
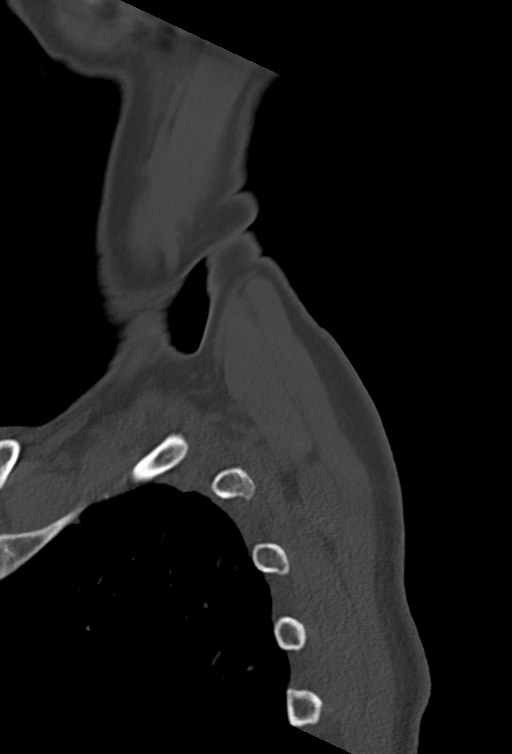
[im 28/82  bone]
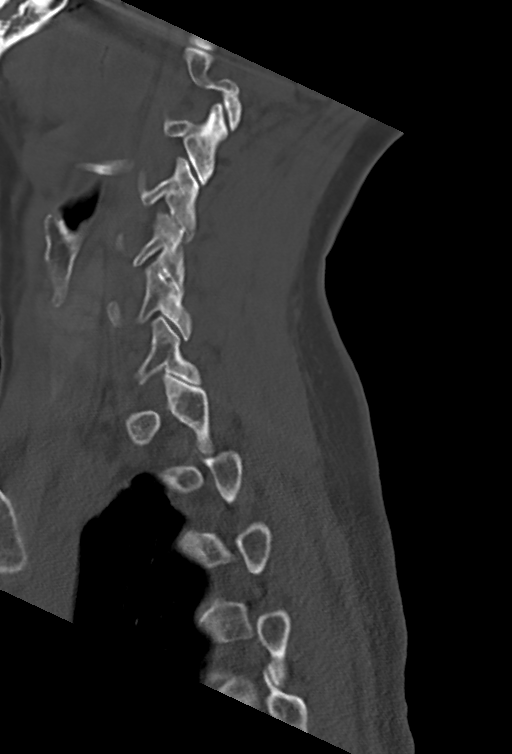
[im 41/82  bone]
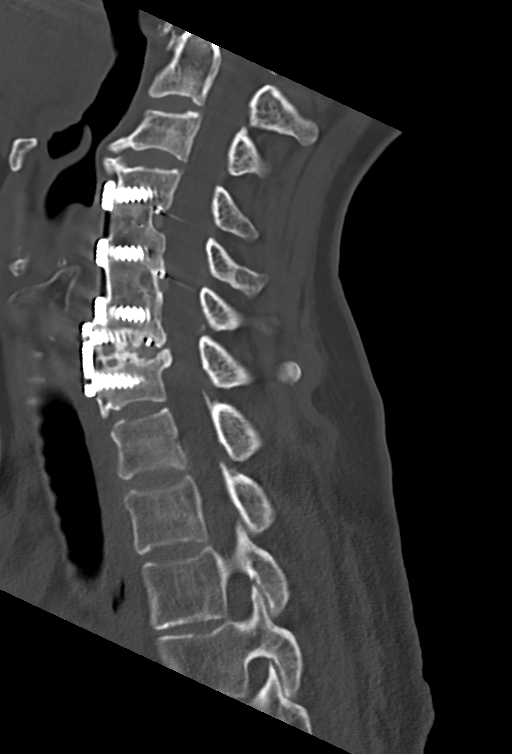
[im 55/82  bone]
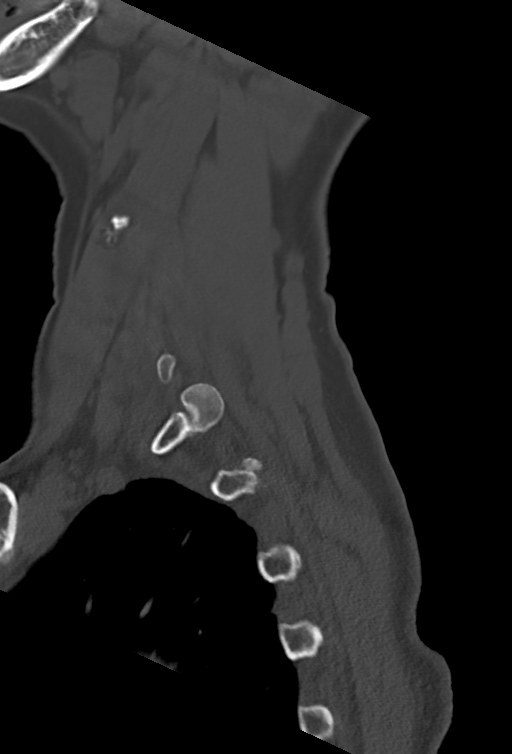
[im 68/82  bone]
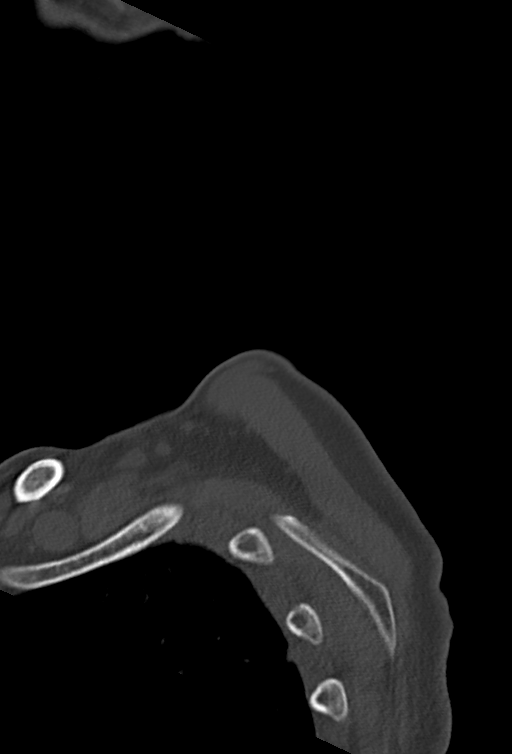

[Series 8: cor bone · coronal · 0.30mm/px · 1 of 69 slices shown]
[im 35/69  bone]
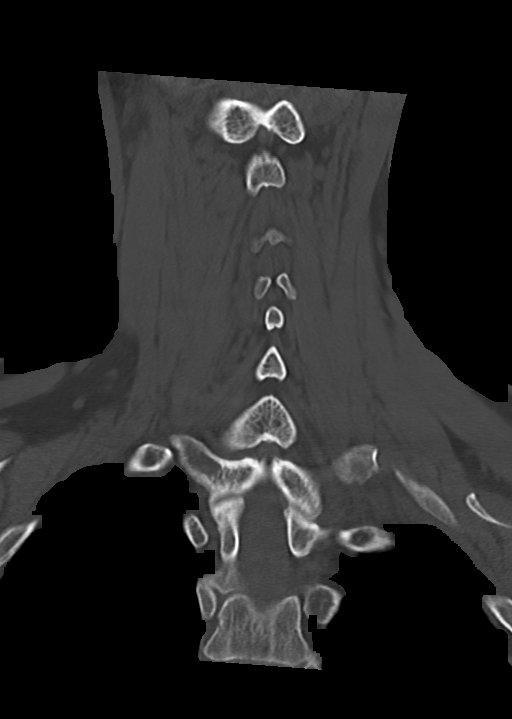

[Series 17: orthogonal axials · axial · 0.21mm/px · z∈[-262,-184]mm · 3 of 81 slices shown, 4 images]
[im 21/81  soft-tissue]
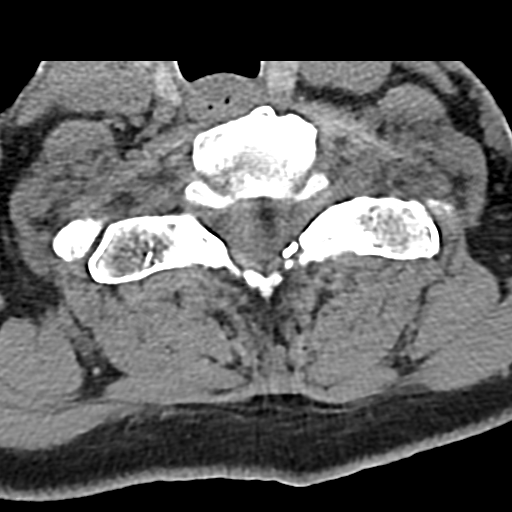
[im 21/81  bone]
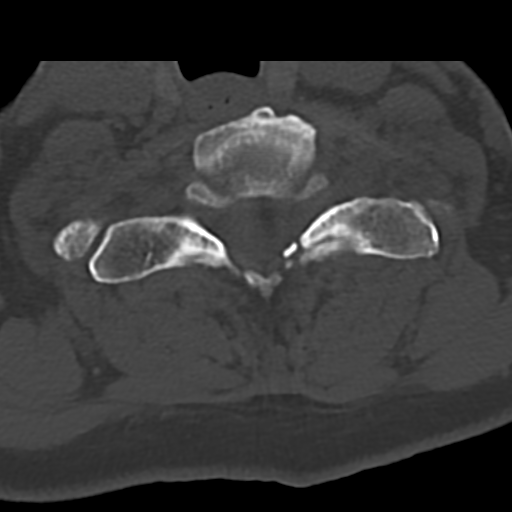
[im 41/81  bone]
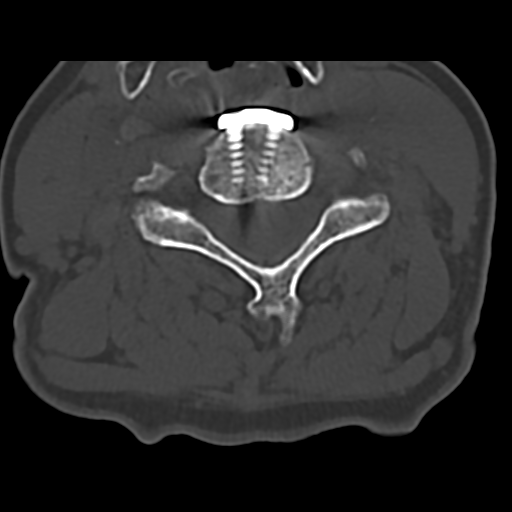
[im 61/81  bone]
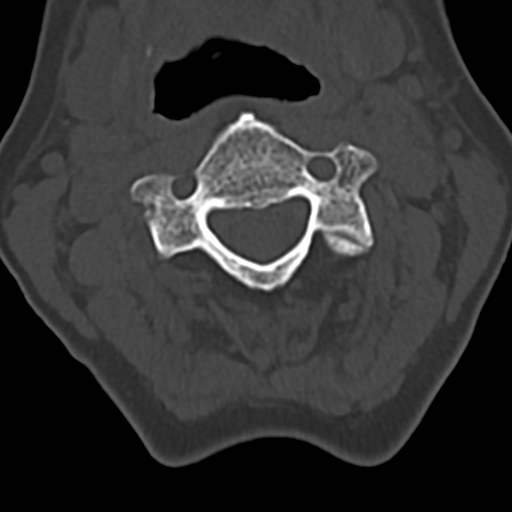

[12 of 33 positions shown; findings below may reference images not displayed]

FINDINGS: CT HEAD FINDINGS

Brain: No evidence of acute infarction, hemorrhage, hydrocephalus,
extra-axial collection or mass lesion/mass effect. Small focus of
encephalomalacia of the right parietal lobe.

Vascular: No hyperdense vessel or unexpected calcification.

Skull: Status post right frontoparietal craniotomy. Negative for
fracture or focal lesion.

Sinuses/Orbits: No acute finding.

Other: None.

CT CERVICAL SPINE FINDINGS

Alignment: Normal.

Skull base and vertebrae: No acute fracture. No primary bone lesion
or focal pathologic process.

Soft tissues and spinal canal: No prevertebral fluid or swelling. No
visible canal hematoma.

Disc levels: Status post anterior cervical discectomy and fusion of
C4 through C7. Mild disc space height loss and osteophytosis of the
remaining anatomic levels.

Upper chest: Negative.

Other: None.
IMPRESSION: 1. No acute intracranial pathology. Small focus of encephalomalacia
of the right parietal lobe with overlying right frontoparietal
craniotomy.

2. No fracture or static subluxation of the cervical spine. Status
post anterior cervical discectomy and fusion of C4 through C7. Mild
disc space height loss and osteophytosis of the remaining anatomic
levels.

## 2020-01-09 NOTE — Pre-Procedure Instructions (Signed)
Your procedure is scheduled on Thursday, November 4, from 07:30 AM- 11:33 AM.  Report to [facility] Main Entrance "A" at 05:30 A.M., and check in at the Admitting office.  Call this number if you have problems the morning of surgery:  [phone]  Call [phone] if you have any questions prior to your surgery date Monday-Friday 8am-4pm    Remember:  Do not eat or drink after midnight the night before your surgery.     Take these medicines the morning of surgery with A SIP OF WATER:  amLODipine (NORVASC)  gabapentin (NEURONTIN) omeprazole (PRILOSEC)  IF NEEDED: ALPRAZolam (XANAX) cyclobenzaprine (FLEXERIL) HYDROcodone-acetaminophen (NORCO)   As of today, STOP taking any Aspirin (unless otherwise instructed by your surgeon) Aleve, Naproxen, Ibuprofen, Motrin, Advil, Goody's, BC's, all herbal medications, fish oil, and all vitamins.           The Morning of Surgery:            Do not wear jewelry, make up, or nail polish.            Do not wear lotions, powders, perfumes, or deodorant.            Do not shave 48 hours prior to surgery.              Do not bring valuables to the hospital.            [facility] is not responsible for any belongings or valuables.  Do NOT Smoke (Tobacco/Vaping) or drink Alcohol 24 hours prior to your procedure.  If you use a CPAP at night, you may bring all equipment for your overnight stay.   Contacts, glasses, dentures or bridgework may not be worn into surgery.      For patients admitted to the hospital, discharge time will be determined by your treatment team.   Patients discharged the day of surgery will not be allowed to drive home, and someone needs to stay with them for 24 hours.    Special instructions:   [facility]- Preparing For Surgery  Before surgery, you can play an important role. Because skin is not sterile, your skin needs to be as free of germs as possible. You can reduce the number of germs on your skin by washing  with CHG (chlorahexidine gluconate) Soap before surgery.  CHG is an antiseptic cleaner which kills germs and bonds with the skin to continue killing germs even after washing.    Oral Hygiene is also important to reduce your risk of infection.  Remember - BRUSH YOUR TEETH THE MORNING OF SURGERY WITH YOUR REGULAR TOOTHPASTE  Please do not use if you have an allergy to CHG or antibacterial soaps. If your skin becomes reddened/irritated stop using the CHG.  Do not shave (including legs and underarms) for at least 48 hours prior to first CHG shower. It is OK to shave your face.  Please follow these instructions carefully.   1. Shower the NIGHT BEFORE SURGERY and the MORNING OF SURGERY with CHG Soap.   2. If you chose to wash your hair, wash your hair first as usual with your normal shampoo.  3. After you shampoo, rinse your hair and body thoroughly to remove the shampoo.  4. Use CHG as you would any other liquid soap. You can apply CHG directly to the skin and wash gently with a scrungie or a clean washcloth.   5. Apply the CHG Soap to your body ONLY FROM THE NECK DOWN.  Do not use on open wounds or open sores. Avoid contact with your eyes, ears, mouth and genitals (private parts). Wash Face and genitals (private parts)  with your normal soap.   6. Wash thoroughly, paying special attention to the area where your surgery will be performed.  7. Thoroughly rinse your body with warm water from the neck down.  8. DO NOT shower/wash with your normal soap after using and rinsing off the CHG Soap.  9. Pat yourself dry with a CLEAN TOWEL.  10. Wear CLEAN PAJAMAS to bed the night before surgery  11. Place CLEAN SHEETS on your bed the night of your first shower and DO NOT SLEEP WITH PETS.   Day of Surgery: SHOWER Wear Clean/Comfortable clothing the morning of surgery Do not apply any deodorants/lotions.   Remember to brush your teeth WITH YOUR REGULAR TOOTHPASTE.   Please read over the  following fact sheets that you were given.

## 2020-01-10 ENCOUNTER — Encounter (HOSPITAL_COMMUNITY)
Admission: RE | Admit: 2020-01-10 | Discharge: 2020-01-10 | Disposition: A | Discharge status: Home / Self Care | Payer: PRIVATE HEALTH INSURANCE | Source: Ambulatory Visit | Attending: Neurosurgery | Admitting: Neurosurgery

## 2020-01-10 ENCOUNTER — Other Ambulatory Visit: Payer: Self-pay

## 2020-01-10 ENCOUNTER — Other Ambulatory Visit (HOSPITAL_COMMUNITY)
Admission: RE | Admit: 2020-01-10 | Discharge: 2020-01-10 | Disposition: A | Discharge status: Home / Self Care | Payer: PRIVATE HEALTH INSURANCE | Source: Ambulatory Visit | Attending: Neurosurgery | Admitting: Neurosurgery

## 2020-01-10 ENCOUNTER — Encounter (HOSPITAL_COMMUNITY): Payer: Self-pay

## 2020-01-10 DIAGNOSIS — Z01812 Encounter for preprocedural laboratory examination: Secondary | ICD-10-CM | POA: Insufficient documentation

## 2020-01-10 DIAGNOSIS — Z20822 Contact with and (suspected) exposure to covid-19: Secondary | ICD-10-CM | POA: Insufficient documentation

## 2020-01-10 HISTORY — DX: Headache, unspecified: R51.9

## 2020-01-10 HISTORY — DX: Unspecified osteoarthritis, unspecified site: M19.90

## 2020-01-10 LAB — CBC
HCT: 36.5 % (ref 36.0–46.0)
Hemoglobin: 11 g/dL — ABNORMAL LOW (ref 12.0–15.0)
MCH: 27.2 pg (ref 26.0–34.0)
MCHC: 30.1 g/dL (ref 30.0–36.0)
MCV: 90.3 fL (ref 80.0–100.0)
Platelets: 217 10*3/uL (ref 150–400)
RBC: 4.04 MIL/uL (ref 3.87–5.11)
RDW: 14.3 % (ref 11.5–15.5)
WBC: 6.8 10*3/uL (ref 4.0–10.5)
nRBC: 0 % (ref 0.0–0.2)

## 2020-01-10 LAB — BASIC METABOLIC PANEL
Anion gap: 8 (ref 5–15)
BUN: 7 mg/dL — ABNORMAL LOW (ref 8–23)
CO2: 27 mmol/L (ref 22–32)
Calcium: 9.4 mg/dL (ref 8.9–10.3)
Chloride: 106 mmol/L (ref 98–111)
Creatinine, Ser: 0.61 mg/dL (ref 0.44–1.00)
GFR, Estimated: >60 mL/min (ref >60)
Glucose, Bld: 100 mg/dL — ABNORMAL HIGH (ref 70–99)
Potassium: 3.8 mmol/L (ref 3.5–5.1)
Sodium: 141 mmol/L (ref 135–145)

## 2020-01-10 LAB — TYPE AND SCREEN
ABO/RH(D): O POS
Antibody Screen: NEGATIVE

## 2020-01-10 LAB — SARS CORONAVIRUS 2 (TAT 6-24 HRS): SARS Coronavirus 2: NEGATIVE

## 2020-01-10 LAB — SURGICAL PCR SCREEN
MRSA, PCR: NEGATIVE
Staphylococcus aureus: NEGATIVE

## 2020-01-10 NOTE — Progress Notes (Signed)
PCP - Gloriajean Dell. Andrey Campanile, MD Cardiologist - Denies  PPM/ICD - Denies  Chest x-ray - N/A EKG - 01/20/19 Stress Test - Denies ECHO - 05/31/10 Cardiac Cath - Denies  Sleep Study - Denies  Patient denies being diabetic.  Blood Thinner Instructions: N/A Aspirin Instructions: N/A  ERAS Protcol - N/A PRE-SURGERY Ensure or G2- N/A  COVID TEST- 01/10/20   Anesthesia review: Yes, review ECHO  Patient denies shortness of breath, fever, cough and chest pain at PAT appointment   All instructions explained to the patient, with a verbal understanding of the material. Patient agrees to go over the instructions while at home for a better understanding. Patient also instructed to self quarantine after being tested for COVID-19. The opportunity to ask questions was provided.

## 2020-01-10 NOTE — H&P (Signed)
Patient ID:   [id number] Patient: Cheyenne Brown  Date of Birth: 21-Mar-1960 Visit Type: Office Visit   Date: 12/28/2019 10:00 AM Provider: Danae Orleans. Venetia Maxon MD   This 59 year old female presents for back pain.  HISTORY OF PRESENT ILLNESS: 1.  back pain  Cheyenne Brown is a 59 year old female was last seen in the clinic on 11/23/2019 for persistent left lower extremity pain.  Today, she continues to complain of severe 10/10 pain with the inability to sit or drive for extended periods of time.  She does also report that her low back pain has spread all across her back and is in now pain constantly.  She would reports that epidural steroid injections have provided no relief and would like to proceed with surgical intervention.  Medications:  Norco 10/325 QD      Medical/Surgical/Interim History Reviewed, no change.  Last detailed document date:03/30/2013.     Family History: Reviewed, no changes.  Last detailed document date:03/30/2013.   Social History: Reviewed, no changes. Last detailed document date: 03/30/2013.    MEDICATIONS: (added, continued or stopped this visit) Started Medication Directions Instruction Stopped 12/26/2015 cyclobenzaprine 10 mg tablet take 1 tablet by oral route 3 times every day   12/05/2019 hydrocodone 10 mg-acetaminophen 325 mg tablet take 1 tablet by oral route  twice a day as needed for pain   05/04/2018 Neurontin 300 mg capsule take 1 capsule by oral route qHS x3, then BID x3days, then TID      ALLERGIES: Ingredient Reaction Medication Name Comment NO KNOWN ALLERGIES    No known allergies. Reviewed, no changes.    PHYSICAL EXAM:  Vitals Date Temp F BP Pulse Ht In Wt Lb BMI BSA Pain Score 12/28/2019  137/89 80 66 115 18.56  10/10   PHYSICAL EXAM Details General Level of Distress: no acute distress Overall Appearance: normal    Cardiovascular Cardiac: regular rate and rhythm without  murmur  Respiratory Lungs: clear to auscultation  Neurological Recent and Remote Memory: normal Attention Span and Concentration:   normal Language: normal Fund of Knowledge: normal  Right Left Sensation: normal normal Upper Extremity Coordination: normal normal  Lower Extremity Coordination: normal normal  Musculoskeletal Gait and Station: normal  Right Left Upper Extremity Muscle Strength: normal normal Lower Extremity Muscle Strength: normal normal Upper Extremity Muscle Tone:  normal normal Lower Extremity Muscle Tone: normal normal   Motor Strength Upper and lower extremity motor strength was tested in the clinically pertinent muscles.     Deep Tendon Reflexes  Right Left Biceps: normal normal Triceps: normal normal Brachioradialis: normal normal Patellar: normal normal Achilles: normal normal  Sensory Sensation was tested at L1 to S1.   Cranial Nerves II. Optic Nerve/Visual Fields: normal III. Oculomotor: normal IV. Trochlear: normal V. Trigeminal: normal VI. Abducens: normal VII. Facial: normal VIII. Acoustic/Vestibular: normal IX. Glossopharyngeal: normal X. Vagus: normal XI. Spinal Accessory: normal XII. Hypoglossal: normal  Motor and other Tests Lhermittes: negative Rhomberg: negative    Right Left Hoffman's: normal normal Clonus: normal normal Babinski: normal normal SLR: negative positive Patrick's Pearlean Brown): negative negative Toe Walk: normal normal Toe Lift: normal normal Heel Walk: normal normal SI Joint: nontender nontender      IMPRESSION:  The patient is complaining of 10/10 pain that affects her bilateral low back and a severe left lower extremity radiculopathy.  She has a positive straight leg raise on the left while seated.  She does have full strength on confrontational testing.  Imaging is most remarkable  for persistent stenosis at the L4-L5 and L5-S1 level as well as severe degenerative changes and facet hypertrophy.  She  has had minimal relief from epidural steroid injections.  I have recommended the patient undergo a left L4-5 and left L5-S1 TLIF with pedicle screw fixation.  The risks and benefits of surgery were discussed with patient in detail and she wishes to proceed.   PLAN: The patient wishes to proceed with surgery. This would consist of left L4-5 and left L5-S1 TLIF with pedicle screw fixation. Detailed patient education was performed today and all her questions were answered.  She is provided a prescription for an LSO brace.   Orders: Instruction(s)/Education: Assessment Instruction R03.0 Lifestyle education Z68.1 Dietary management education, guidance, and counseling Miscellaneous: Assessment  M54.16 LSO Brace  Completed Orders (this encounter) Order Details Reason Side Interpretation Result Initial Treatment Date Region Lifestyle education Patient will monitor and contact primary care physician if needed.       Dietary management education, guidance, and counseling Encouraged patient to eat well balanced diet.        Assessment/Plan  # Detail Type Description  1. Assessment Disc displacement, lumbar (M51.26).     2. 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     3. Assessment Other chronic pain (G89.29).     4. Assessment Facet arthropathy, lumbar (M47.816).     5. Assessment Disc degeneration, lumbar (M51.36).     6. Assessment Radiculopathy, lumbar region (M54.16).  Plan Orders LSO Brace.     7. Assessment Elevated blood-pressure reading, w/o diagnosis of htn (R03.0).     8. Assessment Body mass index (BMI) 19.9 or less, adult (Z68.1).  Plan Orders Today's instructions / counseling include(s) Dietary management education, guidance, and counseling. Clinical information/comments: Encouraged patient to eat well balanced diet.       Pain Management Plan Pain Scale: 10/10. Method:  Numeric Pain Intensity Scale. Location: back. Onset: 11/09/2005. Duration: varies. Quality: discomforting. Pain management follow-up plan of care: Patient will continue medication management..              Provider:  Danae Orleans. Venetia Maxon MD  01/02/2020 01:02 PM    Dictation edited by: Benita Gutter, NP    CC Providers: Benedetto Goad [facility] [address],  [place]  [address]-   Maeola Harman MD  [address] [place], [place] [address]               Electronically signed by Danae Orleans. Venetia Maxon MD on 01/03/2020 02:18 PM

## 2020-01-11 NOTE — Anesthesia Preprocedure Evaluation (Addendum)
Anesthesia Evaluation  Patient identified by MRN, date of birth, ID band Patient awake    Reviewed: Allergy & Precautions, NPO status , Patient's Chart, lab work & pertinent test results  Airway Mallampati: I  TM Distance: >3 FB Neck ROM: Full    Dental  (+) Missing,    Pulmonary former smoker,    Pulmonary exam normal breath sounds clear to auscultation       Cardiovascular hypertension, Pt. on medications Normal cardiovascular exam Rhythm:Regular Rate:Normal  ECG: SR, rate 76   Neuro/Psych  Headaches, negative psych ROS   GI/Hepatic Neg liver ROS, PUD, GERD  Medicated and Controlled,  Endo/Other  negative endocrine ROS  Renal/GU negative Renal ROS     Musculoskeletal  (+) Arthritis ,   Abdominal   Peds  Hematology negative hematology ROS (+)   Anesthesia Other Findings Disc displacement, Lumbar  Reproductive/Obstetrics                           Anesthesia Physical Anesthesia Plan  ASA: II  Anesthesia Plan: General   Post-op Pain Management:    Induction: Intravenous  PONV Risk Score and Plan: 3 and Ondansetron, Dexamethasone, Midazolam and Treatment may vary due to age or medical condition  Airway Management Planned: Oral ETT  Additional Equipment:   Intra-op Plan:   Post-operative Plan: Extubation in OR  Informed Consent: I have reviewed the patients History and Physical, chart, labs and discussed the procedure including the risks, benefits and alternatives for the proposed anesthesia with the patient or authorized representative who has indicated his/her understanding and acceptance.     Dental advisory given  Plan Discussed with: CRNA  Anesthesia Plan Comments:        Anesthesia Quick Evaluation

## 2020-01-12 ENCOUNTER — Inpatient Hospital Stay (HOSPITAL_COMMUNITY): Payer: PRIVATE HEALTH INSURANCE | Admitting: Physician Assistant

## 2020-01-12 ENCOUNTER — Encounter (HOSPITAL_COMMUNITY)
Admission: RE | Disposition: A | Discharge status: Home / Self Care | Payer: Self-pay | Source: Home / Self Care | Attending: Neurosurgery

## 2020-01-12 ENCOUNTER — Inpatient Hospital Stay (HOSPITAL_COMMUNITY)
Admission: RE | Admit: 2020-01-12 | Discharge: 2020-01-13 | DRG: 455 | Disposition: A | Discharge status: Home / Self Care | Payer: PRIVATE HEALTH INSURANCE | Attending: Neurosurgery | Admitting: Neurosurgery

## 2020-01-12 ENCOUNTER — Inpatient Hospital Stay (HOSPITAL_COMMUNITY): Payer: PRIVATE HEALTH INSURANCE

## 2020-01-12 ENCOUNTER — Encounter (HOSPITAL_COMMUNITY): Payer: Self-pay | Admitting: Neurosurgery

## 2020-01-12 ENCOUNTER — Other Ambulatory Visit: Payer: Self-pay

## 2020-01-12 ENCOUNTER — Inpatient Hospital Stay (HOSPITAL_COMMUNITY): Payer: PRIVATE HEALTH INSURANCE | Admitting: Anesthesiology

## 2020-01-12 DIAGNOSIS — M4726 Other spondylosis with radiculopathy, lumbar region: Secondary | ICD-10-CM | POA: Diagnosis present

## 2020-01-12 DIAGNOSIS — M48061 Spinal stenosis, lumbar region without neurogenic claudication: Principal | ICD-10-CM | POA: Diagnosis present

## 2020-01-12 DIAGNOSIS — M5126 Other intervertebral disc displacement, lumbar region: Secondary | ICD-10-CM

## 2020-01-12 DIAGNOSIS — G8929 Other chronic pain: Secondary | ICD-10-CM | POA: Diagnosis present

## 2020-01-12 DIAGNOSIS — R519 Headache, unspecified: Secondary | ICD-10-CM | POA: Diagnosis not present

## 2020-01-12 DIAGNOSIS — M5137 Other intervertebral disc degeneration, lumbosacral region: Secondary | ICD-10-CM | POA: Diagnosis present

## 2020-01-12 DIAGNOSIS — M5442 Lumbago with sciatica, left side: Secondary | ICD-10-CM | POA: Diagnosis present

## 2020-01-12 DIAGNOSIS — M5116 Intervertebral disc disorders with radiculopathy, lumbar region: Secondary | ICD-10-CM | POA: Diagnosis present

## 2020-01-12 DIAGNOSIS — Z20822 Contact with and (suspected) exposure to covid-19: Secondary | ICD-10-CM | POA: Diagnosis present

## 2020-01-12 DIAGNOSIS — Z419 Encounter for procedure for purposes other than remedying health state, unspecified: Principal | ICD-10-CM

## 2020-01-12 HISTORY — PX: TRANSFORAMINAL LUMBAR INTERBODY FUSION (TLIF) WITH PEDICLE SCREW FIXATION 2 LEVEL: SHX6142

## 2020-01-12 LAB — ABO/RH: ABO/RH(D): O POS

## 2020-01-12 SURGERY — TRANSFORAMINAL LUMBAR INTERBODY FUSION (TLIF) WITH PEDICLE SCREW FIXATION 2 LEVEL
Anesthesia: General | Site: Spine Lumbar | Laterality: Left

## 2020-01-12 MED ORDER — PANTOPRAZOLE SODIUM 40 MG PO TBEC: 40.0000 mg | DELAYED_RELEASE_TABLET | Freq: Every day | ORAL | Status: DC

## 2020-01-12 MED ORDER — METHOCARBAMOL 500 MG PO TABS
500.0000 mg | ORAL_TABLET | Freq: Four times a day (QID) | ORAL | Status: DC | PRN
  Filled 2020-01-12: qty 1

## 2020-01-12 MED ORDER — LACTATED RINGERS IV SOLN: INTRAVENOUS | Status: DC

## 2020-01-12 MED ORDER — LINACLOTIDE 145 MCG PO CAPS
290.0000 ug | ORAL_CAPSULE | ORAL | Status: DC
  Filled 2020-01-12: qty 2

## 2020-01-12 MED ORDER — METHOCARBAMOL 500 MG PO TABS
500.0000 mg | ORAL_TABLET | Freq: Four times a day (QID) | ORAL | Status: DC | PRN
  Administered 2020-01-12 – 2020-01-13 (×2): 500 mg via ORAL
  Filled 2020-01-12: qty 1

## 2020-01-12 MED ORDER — SODIUM CHLORIDE 0.9% FLUSH
3.0000 mL | Freq: Two times a day (BID) | INTRAVENOUS | Status: DC
  Administered 2020-01-12: 3 mL via INTRAVENOUS

## 2020-01-12 MED ORDER — ACETAMINOPHEN 500 MG PO TABS
1000.0000 mg | ORAL_TABLET | Freq: Once | ORAL | Status: AC
  Administered 2020-01-12: 1000 mg via ORAL
  Filled 2020-01-12: qty 2

## 2020-01-12 MED ORDER — ALBUMIN HUMAN 5 % IV SOLN: INTRAVENOUS | Status: DC | PRN

## 2020-01-12 MED ORDER — CHLORHEXIDINE GLUCONATE CLOTH 2 % EX PADS: 6.0000 | MEDICATED_PAD | Freq: Once | CUTANEOUS | Status: DC

## 2020-01-12 MED ORDER — ACETAMINOPHEN 650 MG RE SUPP: 650.0000 mg | RECTAL | Status: DC | PRN

## 2020-01-12 MED ORDER — ALPRAZOLAM 0.5 MG PO TABS
1.0000 mg | ORAL_TABLET | Freq: Three times a day (TID) | ORAL | Status: DC | PRN
  Administered 2020-01-12: 1 mg via ORAL
  Filled 2020-01-12: qty 2

## 2020-01-12 MED ORDER — BUPIVACAINE HCL (PF) 0.5 % IJ SOLN
INTRAMUSCULAR | Status: DC | PRN
  Administered 2020-01-12: 5 mL

## 2020-01-12 MED ORDER — ONDANSETRON HCL 4 MG/2ML IJ SOLN: 4.0000 mg | Freq: Four times a day (QID) | INTRAMUSCULAR | Status: DC | PRN

## 2020-01-12 MED ORDER — PROPOFOL 10 MG/ML IV BOLUS
INTRAVENOUS | Status: DC | PRN
  Administered 2020-01-12: 150 mg via INTRAVENOUS

## 2020-01-12 MED ORDER — CEFAZOLIN SODIUM-DEXTROSE 2-4 GM/100ML-% IV SOLN
2.0000 g | Freq: Three times a day (TID) | INTRAVENOUS | Status: AC
  Administered 2020-01-12 (×2): 2 g via INTRAVENOUS
  Filled 2020-01-12 (×2): qty 100

## 2020-01-12 MED ORDER — HYDROCODONE-ACETAMINOPHEN 10-325 MG PO TABS: 1.0000 | ORAL_TABLET | Freq: Two times a day (BID) | ORAL | Status: DC | PRN

## 2020-01-12 MED ORDER — SODIUM CHLORIDE 0.9% FLUSH: 3.0000 mL | INTRAVENOUS | Status: DC | PRN

## 2020-01-12 MED ORDER — 0.9 % SODIUM CHLORIDE (POUR BTL) OPTIME
TOPICAL | Status: DC | PRN
  Administered 2020-01-12: 1000 mL

## 2020-01-12 MED ORDER — THROMBIN 20000 UNITS EX SOLR
CUTANEOUS | Status: AC
  Filled 2020-01-12: qty 20000

## 2020-01-12 MED ORDER — ORAL CARE MOUTH RINSE: 15.0000 mL | Freq: Once | OROMUCOSAL | Status: AC

## 2020-01-12 MED ORDER — PROPOFOL 10 MG/ML IV BOLUS
INTRAVENOUS | Status: AC
  Filled 2020-01-12: qty 40

## 2020-01-12 MED ORDER — SUGAMMADEX SODIUM 200 MG/2ML IV SOLN
INTRAVENOUS | Status: DC | PRN
  Administered 2020-01-12: 125 mg via INTRAVENOUS

## 2020-01-12 MED ORDER — LIDOCAINE 2% (20 MG/ML) 5 ML SYRINGE
INTRAMUSCULAR | Status: AC
  Filled 2020-01-12: qty 5

## 2020-01-12 MED ORDER — AMLODIPINE BESYLATE 5 MG PO TABS
5.0000 mg | ORAL_TABLET | Freq: Every day | ORAL | Status: DC
  Administered 2020-01-13: 5 mg via ORAL
  Filled 2020-01-12 (×2): qty 1

## 2020-01-12 MED ORDER — PROMETHAZINE HCL 25 MG/ML IJ SOLN: 6.2500 mg | INTRAMUSCULAR | Status: DC | PRN

## 2020-01-12 MED ORDER — ONDANSETRON HCL 4 MG/2ML IJ SOLN
INTRAMUSCULAR | Status: AC
  Filled 2020-01-12: qty 2

## 2020-01-12 MED ORDER — CEFAZOLIN SODIUM-DEXTROSE 2-4 GM/100ML-% IV SOLN
2.0000 g | INTRAVENOUS | Status: AC
  Administered 2020-01-12: 2 g via INTRAVENOUS
  Filled 2020-01-12: qty 100

## 2020-01-12 MED ORDER — DEXAMETHASONE SODIUM PHOSPHATE 10 MG/ML IJ SOLN
INTRAMUSCULAR | Status: AC
  Filled 2020-01-12: qty 1

## 2020-01-12 MED ORDER — BUPIVACAINE LIPOSOME 1.3 % IJ SUSP
INTRAMUSCULAR | Status: DC | PRN
  Administered 2020-01-12: 20 mL

## 2020-01-12 MED ORDER — CHLORHEXIDINE GLUCONATE 0.12 % MT SOLN
15.0000 mL | Freq: Once | OROMUCOSAL | Status: AC
  Administered 2020-01-12: 15 mL via OROMUCOSAL
  Filled 2020-01-12: qty 15

## 2020-01-12 MED ORDER — HYDROMORPHONE HCL 1 MG/ML IJ SOLN
INTRAMUSCULAR | Status: AC
  Administered 2020-01-12: 0.5 mg via INTRAVENOUS
  Filled 2020-01-12: qty 1

## 2020-01-12 MED ORDER — THROMBIN 20000 UNITS EX SOLR
CUTANEOUS | Status: DC | PRN
  Administered 2020-01-12: 20 mL via TOPICAL

## 2020-01-12 MED ORDER — DEXAMETHASONE SODIUM PHOSPHATE 10 MG/ML IJ SOLN
INTRAMUSCULAR | Status: DC | PRN
  Administered 2020-01-12: 4 mg via INTRAVENOUS

## 2020-01-12 MED ORDER — POLYETHYLENE GLYCOL 3350 17 G PO PACK: 17.0000 g | PACK | Freq: Every day | ORAL | Status: DC | PRN

## 2020-01-12 MED ORDER — MENTHOL 3 MG MT LOZG: 1.0000 | LOZENGE | OROMUCOSAL | Status: DC | PRN

## 2020-01-12 MED ORDER — ACETAMINOPHEN 325 MG PO TABS
650.0000 mg | ORAL_TABLET | ORAL | Status: DC | PRN
  Administered 2020-01-13: 650 mg via ORAL
  Filled 2020-01-12: qty 2

## 2020-01-12 MED ORDER — SODIUM CHLORIDE 0.9 % IV SOLN: 250.0000 mL | INTRAVENOUS | Status: DC

## 2020-01-12 MED ORDER — OXYCODONE HCL 5 MG/5ML PO SOLN: 5.0000 mg | Freq: Once | ORAL | Status: AC | PRN

## 2020-01-12 MED ORDER — THROMBIN 5000 UNITS EX SOLR
CUTANEOUS | Status: AC
  Filled 2020-01-12: qty 5000

## 2020-01-12 MED ORDER — THROMBIN 5000 UNITS EX SOLR
OROMUCOSAL | Status: DC | PRN
  Administered 2020-01-12: 5 mL via TOPICAL

## 2020-01-12 MED ORDER — PHENYLEPHRINE HCL-NACL 10-0.9 MG/250ML-% IV SOLN
INTRAVENOUS | Status: DC | PRN
  Administered 2020-01-12: 25 ug/min via INTRAVENOUS

## 2020-01-12 MED ORDER — ALUM & MAG HYDROXIDE-SIMETH 200-200-20 MG/5ML PO SUSP: 30.0000 mL | Freq: Four times a day (QID) | ORAL | Status: DC | PRN

## 2020-01-12 MED ORDER — KCL IN DEXTROSE-NACL 20-5-0.45 MEQ/L-%-% IV SOLN: INTRAVENOUS | Status: DC

## 2020-01-12 MED ORDER — BUPIVACAINE HCL (PF) 0.5 % IJ SOLN
INTRAMUSCULAR | Status: AC
  Filled 2020-01-12: qty 30

## 2020-01-12 MED ORDER — FENTANYL CITRATE (PF) 250 MCG/5ML IJ SOLN
INTRAMUSCULAR | Status: DC | PRN
  Administered 2020-01-12 (×3): 50 ug via INTRAVENOUS
  Administered 2020-01-12: 100 ug via INTRAVENOUS

## 2020-01-12 MED ORDER — HYDROMORPHONE HCL 1 MG/ML IJ SOLN
INTRAMUSCULAR | Status: AC
  Administered 2020-01-12: 0.25 mg via INTRAVENOUS
  Filled 2020-01-12: qty 1

## 2020-01-12 MED ORDER — CYCLOBENZAPRINE HCL 10 MG PO TABS
10.0000 mg | ORAL_TABLET | Freq: Three times a day (TID) | ORAL | Status: DC | PRN
  Administered 2020-01-12: 10 mg via ORAL
  Filled 2020-01-12: qty 1

## 2020-01-12 MED ORDER — SODIUM CHLORIDE 0.9 % IV SOLN: INTRAVENOUS | Status: DC | PRN

## 2020-01-12 MED ORDER — ROCURONIUM BROMIDE 10 MG/ML (PF) SYRINGE
PREFILLED_SYRINGE | INTRAVENOUS | Status: AC
  Filled 2020-01-12: qty 10

## 2020-01-12 MED ORDER — LIDOCAINE 2% (20 MG/ML) 5 ML SYRINGE
INTRAMUSCULAR | Status: DC | PRN
  Administered 2020-01-12: 60 mg via INTRAVENOUS

## 2020-01-12 MED ORDER — METHOCARBAMOL 1000 MG/10ML IJ SOLN
500.0000 mg | Freq: Four times a day (QID) | INTRAVENOUS | Status: DC | PRN
  Filled 2020-01-12: qty 5

## 2020-01-12 MED ORDER — BISACODYL 10 MG RE SUPP: 10.0000 mg | Freq: Every day | RECTAL | Status: DC | PRN

## 2020-01-12 MED ORDER — ONDANSETRON HCL 4 MG PO TABS: 4.0000 mg | ORAL_TABLET | Freq: Four times a day (QID) | ORAL | Status: DC | PRN

## 2020-01-12 MED ORDER — HYDROMORPHONE HCL 1 MG/ML IJ SOLN: 0.5000 mg | INTRAMUSCULAR | Status: DC | PRN

## 2020-01-12 MED ORDER — FENTANYL CITRATE (PF) 250 MCG/5ML IJ SOLN
INTRAMUSCULAR | Status: AC
  Filled 2020-01-12: qty 5

## 2020-01-12 MED ORDER — MIDAZOLAM HCL 2 MG/2ML IJ SOLN
INTRAMUSCULAR | Status: AC
  Filled 2020-01-12: qty 2

## 2020-01-12 MED ORDER — OXYCODONE HCL 5 MG PO TABS
ORAL_TABLET | ORAL | Status: AC
  Filled 2020-01-12: qty 1

## 2020-01-12 MED ORDER — ROCURONIUM BROMIDE 10 MG/ML (PF) SYRINGE
PREFILLED_SYRINGE | INTRAVENOUS | Status: DC | PRN
  Administered 2020-01-12: 40 mg via INTRAVENOUS
  Administered 2020-01-12 (×2): 30 mg via INTRAVENOUS

## 2020-01-12 MED ORDER — OXYCODONE HCL 5 MG PO TABS: 5.0000 mg | ORAL_TABLET | ORAL | Status: DC | PRN

## 2020-01-12 MED ORDER — HYDROCODONE-ACETAMINOPHEN 10-325 MG PO TABS
2.0000 | ORAL_TABLET | ORAL | Status: DC | PRN
  Administered 2020-01-12 – 2020-01-13 (×5): 2 via ORAL
  Filled 2020-01-12 (×5): qty 2

## 2020-01-12 MED ORDER — OXYCODONE HCL 5 MG PO TABS
5.0000 mg | ORAL_TABLET | Freq: Once | ORAL | Status: AC | PRN
  Administered 2020-01-12: 5 mg via ORAL

## 2020-01-12 MED ORDER — MIDAZOLAM HCL 5 MG/5ML IJ SOLN
INTRAMUSCULAR | Status: DC | PRN
  Administered 2020-01-12: 2 mg via INTRAVENOUS

## 2020-01-12 MED ORDER — LIDOCAINE-EPINEPHRINE 1 %-1:100000 IJ SOLN
INTRAMUSCULAR | Status: AC
  Filled 2020-01-12: qty 1

## 2020-01-12 MED ORDER — FLEET ENEMA 7-19 GM/118ML RE ENEM: 1.0000 | ENEMA | Freq: Once | RECTAL | Status: DC | PRN

## 2020-01-12 MED ORDER — GABAPENTIN 300 MG PO CAPS
300.0000 mg | ORAL_CAPSULE | Freq: Three times a day (TID) | ORAL | Status: DC
  Administered 2020-01-12 – 2020-01-13 (×3): 300 mg via ORAL
  Filled 2020-01-12 (×3): qty 1

## 2020-01-12 MED ORDER — DOCUSATE SODIUM 100 MG PO CAPS
100.0000 mg | ORAL_CAPSULE | Freq: Two times a day (BID) | ORAL | Status: DC
  Administered 2020-01-12 – 2020-01-13 (×3): 100 mg via ORAL
  Filled 2020-01-12 (×3): qty 1

## 2020-01-12 MED ORDER — HYDROMORPHONE HCL 1 MG/ML IJ SOLN
0.2500 mg | INTRAMUSCULAR | Status: DC | PRN
  Administered 2020-01-12 (×2): 0.5 mg via INTRAVENOUS
  Administered 2020-01-12: 0.25 mg via INTRAVENOUS

## 2020-01-12 MED ORDER — ZOLPIDEM TARTRATE 5 MG PO TABS: 5.0000 mg | ORAL_TABLET | Freq: Every evening | ORAL | Status: DC | PRN

## 2020-01-12 MED ORDER — PHENOL 1.4 % MT LIQD: 1.0000 | OROMUCOSAL | Status: DC | PRN

## 2020-01-12 MED ORDER — BUPIVACAINE LIPOSOME 1.3 % IJ SUSP
20.0000 mL | Freq: Once | INTRAMUSCULAR | Status: DC
  Filled 2020-01-12: qty 20

## 2020-01-12 MED ORDER — ONDANSETRON HCL 4 MG/2ML IJ SOLN
INTRAMUSCULAR | Status: DC | PRN
  Administered 2020-01-12: 4 mg via INTRAVENOUS

## 2020-01-12 MED ORDER — PHENYLEPHRINE 40 MCG/ML (10ML) SYRINGE FOR IV PUSH (FOR BLOOD PRESSURE SUPPORT)
PREFILLED_SYRINGE | INTRAVENOUS | Status: AC
  Filled 2020-01-12: qty 10

## 2020-01-12 MED ORDER — PHENYLEPHRINE 40 MCG/ML (10ML) SYRINGE FOR IV PUSH (FOR BLOOD PRESSURE SUPPORT)
PREFILLED_SYRINGE | INTRAVENOUS | Status: DC | PRN
  Administered 2020-01-12 (×2): 120 ug via INTRAVENOUS
  Administered 2020-01-12: 80 ug via INTRAVENOUS

## 2020-01-12 MED ORDER — LIDOCAINE-EPINEPHRINE 1 %-1:100000 IJ SOLN
INTRAMUSCULAR | Status: DC | PRN
  Administered 2020-01-12: 5 mL

## 2020-01-12 MED ORDER — PANTOPRAZOLE SODIUM 40 MG IV SOLR
40.0000 mg | Freq: Every day | INTRAVENOUS | Status: DC
  Administered 2020-01-12: 40 mg via INTRAVENOUS
  Filled 2020-01-12: qty 40

## 2020-01-12 SURGICAL SUPPLY — 91 items
ADH SKN CLS APL DERMABOND .7 (GAUZE/BANDAGES/DRESSINGS) ×1
BASKET BONE COLLECTION (BASKET) ×3 IMPLANT
BLADE CLIPPER SURG (BLADE) IMPLANT
BONE CANC CHIPS 40CC CAN1/2 (Bone Implant) ×3 IMPLANT
BUR MATCHSTICK NEURO 3.0 LAGG (BURR) ×3 IMPLANT
BUR PRECISION FLUTE 5.0 (BURR) ×3 IMPLANT
CANISTER SUCT 3000ML PPV (MISCELLANEOUS) ×3 IMPLANT
CARTRIDGE OIL MAESTRO DRILL (MISCELLANEOUS) ×1 IMPLANT
CHIPS CANC BONE 40CC CAN1/2 (Bone Implant) ×1 IMPLANT
CNTNR URN SCR LID CUP LEK RST (MISCELLANEOUS) ×1 IMPLANT
CONT SPEC 4OZ STRL OR WHT (MISCELLANEOUS) ×3
COVER BACK TABLE 24X17X13 BIG (DRAPES) IMPLANT
COVER BACK TABLE 60X90IN (DRAPES) ×3 IMPLANT
COVER WAND RF STERILE (DRAPES) ×1 IMPLANT
DECANTER SPIKE VIAL GLASS SM (MISCELLANEOUS) ×3 IMPLANT
DERMABOND ADVANCED (GAUZE/BANDAGES/DRESSINGS) ×2
DERMABOND ADVANCED .7 DNX12 (GAUZE/BANDAGES/DRESSINGS) ×1 IMPLANT
DIFFUSER DRILL AIR PNEUMATIC (MISCELLANEOUS) ×3 IMPLANT
DRAPE C-ARM 42X72 X-RAY (DRAPES) ×3 IMPLANT
DRAPE C-ARMOR (DRAPES) ×3 IMPLANT
DRAPE LAPAROTOMY 100X72X124 (DRAPES) ×3 IMPLANT
DRAPE SURG 17X23 STRL (DRAPES) ×3 IMPLANT
DRSG OPSITE POSTOP 4X6 (GAUZE/BANDAGES/DRESSINGS) ×2 IMPLANT
DURAPREP 26ML APPLICATOR (WOUND CARE) ×3 IMPLANT
ELECT BLADE 4.0 EZ CLEAN MEGAD (MISCELLANEOUS) ×3
ELECT REM PT RETURN 9FT ADLT (ELECTROSURGICAL) ×3
ELECTRODE BLDE 4.0 EZ CLN MEGD (MISCELLANEOUS) IMPLANT
ELECTRODE REM PT RTRN 9FT ADLT (ELECTROSURGICAL) ×1 IMPLANT
GAUZE 4X4 16PLY RFD (DISPOSABLE) IMPLANT
GAUZE SPONGE 4X4 12PLY STRL (GAUZE/BANDAGES/DRESSINGS) ×1 IMPLANT
GLOVE BIO SURGEON STRL SZ7.5 (GLOVE) ×4 IMPLANT
GLOVE BIO SURGEON STRL SZ8 (GLOVE) ×6 IMPLANT
GLOVE BIOGEL PI IND STRL 6.5 (GLOVE) IMPLANT
GLOVE BIOGEL PI IND STRL 7.5 (GLOVE) IMPLANT
GLOVE BIOGEL PI IND STRL 8 (GLOVE) ×2 IMPLANT
GLOVE BIOGEL PI IND STRL 8.5 (GLOVE) ×2 IMPLANT
GLOVE BIOGEL PI INDICATOR 6.5 (GLOVE) ×2
GLOVE BIOGEL PI INDICATOR 7.5 (GLOVE) ×2
GLOVE BIOGEL PI INDICATOR 8 (GLOVE) ×4
GLOVE BIOGEL PI INDICATOR 8.5 (GLOVE) ×4
GLOVE ECLIPSE 8.0 STRL XLNG CF (GLOVE) ×6 IMPLANT
GLOVE EXAM NITRILE XL STR (GLOVE) IMPLANT
GLOVE SS BIOGEL STRL SZ 7 (GLOVE) IMPLANT
GLOVE SUPERSENSE BIOGEL SZ 7 (GLOVE) ×2
GLOVE SURG SS PI 6.0 STRL IVOR (GLOVE) ×8 IMPLANT
GOWN STRL REUS W/ TWL LRG LVL3 (GOWN DISPOSABLE) IMPLANT
GOWN STRL REUS W/ TWL XL LVL3 (GOWN DISPOSABLE) ×3 IMPLANT
GOWN STRL REUS W/TWL 2XL LVL3 (GOWN DISPOSABLE) ×2 IMPLANT
GOWN STRL REUS W/TWL LRG LVL3 (GOWN DISPOSABLE) ×6
GOWN STRL REUS W/TWL XL LVL3 (GOWN DISPOSABLE) ×9
GRAFT BNE CHIP CANC 1-8 40 (Bone Implant) IMPLANT
IMPL TLX20 10X11X26 20D (Cage) IMPLANT
IMPL TLX20 11X11X26 20D (Cage) IMPLANT
KIT BASIN OR (CUSTOM PROCEDURE TRAY) ×3 IMPLANT
KIT INFUSE X SMALL 1.4CC (Orthopedic Implant) ×2 IMPLANT
KIT POSITION SURG JACKSON T1 (MISCELLANEOUS) ×3 IMPLANT
KIT TURNOVER KIT B (KITS) ×3 IMPLANT
MILL MEDIUM DISP (BLADE) ×2 IMPLANT
NDL HYPO 21X1.5 SAFETY (NEEDLE) IMPLANT
NDL HYPO 25X1 1.5 SAFETY (NEEDLE) ×1 IMPLANT
NDL SPNL 18GX3.5 QUINCKE PK (NEEDLE) IMPLANT
NEEDLE HYPO 21X1.5 SAFETY (NEEDLE) ×3 IMPLANT
NEEDLE HYPO 25X1 1.5 SAFETY (NEEDLE) ×3 IMPLANT
NEEDLE SPNL 18GX3.5 QUINCKE PK (NEEDLE) ×3 IMPLANT
NS IRRIG 1000ML POUR BTL (IV SOLUTION) ×3 IMPLANT
OIL CARTRIDGE MAESTRO DRILL (MISCELLANEOUS) ×3
PACK LAMINECTOMY NEURO (CUSTOM PROCEDURE TRAY) ×3 IMPLANT
PAD ARMBOARD 7.5X6 YLW CONV (MISCELLANEOUS) ×9 IMPLANT
PATTIES SURGICAL .5 X.5 (GAUZE/BANDAGES/DRESSINGS) IMPLANT
PATTIES SURGICAL .5 X1 (DISPOSABLE) IMPLANT
PATTIES SURGICAL 1X1 (DISPOSABLE) IMPLANT
ROD RELIN-O LORD 5.5X65MM (Rod) ×2 IMPLANT
ROD RELINE TI LORD 5.5X70 (Rod) ×2 IMPLANT
SCREW LOCK RELINE 5.5 TULIP (Screw) ×12 IMPLANT
SCREW RELINE-O POLY 6.5X40 (Screw) ×8 IMPLANT
SCREW RELINE-O POLY 6.5X45 (Screw) ×4 IMPLANT
SPONGE LAP 4X18 RFD (DISPOSABLE) IMPLANT
SPONGE SURGIFOAM ABS GEL 100 (HEMOSTASIS) ×3 IMPLANT
STAPLER SKIN PROX WIDE 3.9 (STAPLE) IMPLANT
SUT VIC AB 1 CT1 18XBRD ANBCTR (SUTURE) ×2 IMPLANT
SUT VIC AB 1 CT1 8-18 (SUTURE) ×6
SUT VIC AB 2-0 CT1 18 (SUTURE) ×4 IMPLANT
SUT VIC AB 3-0 SH 8-18 (SUTURE) ×6 IMPLANT
SYR 20ML LL LF (SYRINGE) ×2 IMPLANT
SYR 5ML LL (SYRINGE) IMPLANT
TLX20 IMPLANT 10X11X26 20D (Cage) ×3 IMPLANT
TLX20 IMPLANT 11X11X26 20D (Cage) ×3 IMPLANT
TOWEL GREEN STERILE (TOWEL DISPOSABLE) ×3 IMPLANT
TOWEL GREEN STERILE FF (TOWEL DISPOSABLE) ×3 IMPLANT
TRAY FOLEY MTR SLVR 16FR STAT (SET/KITS/TRAYS/PACK) ×3 IMPLANT
WATER STERILE IRR 1000ML POUR (IV SOLUTION) ×3 IMPLANT

## 2020-01-12 NOTE — Evaluation (Signed)
Physical Therapy Evaluation Patient Details Name: Cheyenne Brown MRN: [id number] DOB: Jul 26, 1960 Today's Date: 01/12/2020   History of Present Illness  The pt is a 59 yo female presenting s/p L4-5 TLIF on 11/04 due to persistent Left LE pain. PMH includes: HTN, and GERD.   Clinical Impression  Pt in bed upon arrival of PT, agreeable to evaluation at this time. Prior to admission the pt was independent with use of a cane for mobility in the home, and was able to complete ADLs without assist. The pt reports she was working until Monday 11/1, and she would like to be able to return to work eventually following this surgery. The pt was able to complete multiple transfers in addition to a good bout of hallway ambulation and stair training with HHA of 1 for stability. The pt will continue to benefit from skilled PT to improve strength, endurance, and dynamic stability prior to d/c home to reduce dependence on caregiver support and assist.      Follow Up Recommendations Home health PT;Supervision for mobility/OOB    Equipment Recommendations  None recommended by PT (pt is well equipt)    Recommendations for Other Services       Precautions / Restrictions Precautions Precautions: Back;Fall Precaution Booklet Issued: Yes (comment) Precaution Comments: pt able to recall 3/3 Required Braces or Orthoses: Spinal Brace Spinal Brace: Lumbar corset;Applied in sitting position Restrictions Weight Bearing Restrictions: No      Mobility  Bed Mobility Overal bed mobility: Needs Assistance Bed Mobility: Rolling;Sit to Supine Rolling: Min guard     Sit to supine: Min guard   General bed mobility comments: minG and vc for log roll, pt able to complete with verbal cues and no assist    Transfers Overall transfer level: Needs assistance   Transfers: Sit to/from Stand Sit to Stand: Min guard         General transfer comment: minG and increased time to rise for sitting, VC for hand  positioning  Ambulation/Gait Ambulation/Gait assistance: Min guard Gait Distance (Feet): 200 Feet Assistive device: 1 person hand held assist Gait Pattern/deviations: Step-through pattern;Decreased step length - right;Decreased step length - left;Decreased stride length Gait velocity: 0.25 m/s Gait velocity interpretation: <1.31 ft/sec, indicative of household ambulator General Gait Details: small steps bilaterally, pt reaching for UE support but improved with continued gait  Stairs Stairs: Yes Stairs assistance: Min assist Stair Management: No rails;Forwards;Step to pattern Number of Stairs: 1 (x2) General stair comments: HHA to mimic home environment, pt able to complete x2 step-ups without LOB with miNA  Wheelchair Mobility    Modified Rankin (Stroke Patients Only)       Balance                                             Pertinent Vitals/Pain Pain Assessment: Faces Faces Pain Scale: Hurts little more Pain Location: back Pain Descriptors / Indicators: Grimacing;Discomfort;Sore Pain Intervention(s): Limited activity within patient's tolerance;Monitored during session;Repositioned    Home Living Family/patient expects to be discharged to:: Private residence Living Arrangements: Spouse/significant other Available Help at Discharge: Family;Available 24 hours/day (for 1.5 weeks) Type of Home: House Home Access: Stairs to enter Entrance Stairs-Rails: None Entrance Stairs-Number of Steps: 2 Home Layout: One level Home Equipment: Walker - 2 wheels;Cane - single point;Bedside commode;Shower seat;Grab bars - tub/shower;Hand held shower head Additional Comments: previously using cane for  mobility    Prior Function Level of Independence: Independent with assistive device(s)         Comments: pt reports independent with use of cane, still working until [employer] prior to surgery thursday     Hand Dominance        Extremity/Trunk Assessment   Upper  Extremity Assessment Upper Extremity Assessment: Defer to OT evaluation    Lower Extremity Assessment Lower Extremity Assessment: Generalized weakness (pt denies difference in sensation, no knee buckling)    Cervical / Trunk Assessment Cervical / Trunk Assessment: Other exceptions Cervical / Trunk Exceptions: s/p back surgery  Communication   Communication: No difficulties  Cognition Arousal/Alertness: Awake/alert Behavior During Therapy: WFL for tasks assessed/performed Overall Cognitive Status: Within Functional Limits for tasks assessed                                        General Comments General comments (skin integrity, edema, etc.): VSS on RA    Exercises     Assessment/Plan    PT Assessment Patient needs continued PT services  PT Problem List Decreased strength;Decreased activity tolerance;Decreased balance;Decreased mobility;Decreased coordination;Pain;Decreased knowledge of use of DME       PT Treatment Interventions DME instruction;Gait training;Stair training;Functional mobility training;Therapeutic activities;Therapeutic exercise;Balance training;Patient/family education    PT Goals (Current goals can be found in the Care Plan section)  Acute Rehab PT Goals Patient Stated Goal: return home PT Goal Formulation: With patient Time For Goal Achievement: 01/26/20 Potential to Achieve Goals: Good    Frequency Min 5X/week   Barriers to discharge           AM-PAC PT "6 Clicks" Mobility  Outcome Measure Help needed turning from your back to your side while in a flat bed without using bedrails?: A Little Help needed moving from lying on your back to sitting on the side of a flat bed without using bedrails?: A Little Help needed moving to and from a bed to a chair (including a wheelchair)?: A Little Help needed standing up from a chair using your arms (e.g., wheelchair or bedside chair)?: A Little Help needed to walk in hospital room?: A  Little Help needed climbing 3-5 steps with a railing? : A Little 6 Click Score: 18    End of Session Equipment Utilized During Treatment: Gait belt;Back brace Activity Tolerance: Patient tolerated treatment well;Patient limited by fatigue Patient left: in bed;with call bell/phone within reach;with family/visitor present Nurse Communication: Mobility status PT Visit Diagnosis: Other abnormalities of gait and mobility (R26.89);Pain Pain - part of body:  (back)    Time: 5701-7793 PT Time Calculation (min) (ACUTE ONLY): 31 min   Charges:   PT Evaluation $PT Eval Moderate Complexity: 1 Mod PT Treatments $Gait Training: 8-22 mins        Rolm Baptise, PT, DPT   Acute Rehabilitation Department Pager #: [phone]  Gaetana Michaelis 01/12/2020, 4:34 PM

## 2020-01-12 NOTE — Brief Op Note (Signed)
01/12/2020  11:03 AM  PATIENT:  Cheyenne Brown  59 y.o. female  PRE-OPERATIVE DIAGNOSIS:  Disc displacement, Lumbar, lumbar stenosis, spondylosis, radiculopathy, lumbago L 45 and L 5 S 1 levels  POST-OPERATIVE DIAGNOSIS:   Disc displacement, Lumbar, lumbar stenosis, spondylosis, radiculopathy, lumbago L 45 and L 5 S 1 levels  PROCEDURE:  Procedure(s) with comments: Left Lumbar Four-Five Lumbar Five Sacral One Transforaminal lumbar interbody fusion (Left) - posterior with expandable titanium cages, autograft, pedicle screw fixation, posterolateral arthrodesis  SURGEON:  Surgeon(s) and Role:    Maeola Harman, MD - Primary  PHYSICIAN ASSISTANT:McDaniel, NP   ASSISTANTS: Poteat, RN   ANESTHESIA:   general  EBL:  300 mL   BLOOD ADMINISTERED:100 CC CELLSAVER  DRAINS: none   LOCAL MEDICATIONS USED:  MARCAINE    and LIDOCAINE   SPECIMEN:  No Specimen  DISPOSITION OF SPECIMEN:  N/A  COUNTS:  YES  TOURNIQUET:  * No tourniquets in log *  DICTATION: Patient is 59 year old woman with  HNP, spondylosis,  stenosis, DDD, radiculopathy L4/5, L5/S1. She has a severe left leg pain and weakness. It was elected to take her to surgery for decompression and fusion at L4/5 and L5/S1 levels.  The patient had prior discectomies on the left, but had severe stenosis and spondylosis with recurrent disc herniations.  I felt these would best be treated with facetectomies and TLIF fusion from the left.  Procedure: Patient was placed in a prone position on the [place] table after smooth and uncomplicated induction of general endotracheal anesthesia. Her low back was prepped and draped in usual sterile fashion with betadine scrub and DuraPrep after marking relevant anatomy with C arm. Area of incision was infiltrated with local lidocaine. Incision was made to the lumbodorsal fascia was incised and exposure was performed of the L4/5, L5/S1 spinous processes laminae facet joint and transverse processes.  Intraoperative x-ray was obtained which confirmed correct orientation. A total hemi- laminectomy of L4 and L5 was performed with disarticulation of the facet joints at this level and thorough decompression was performed of both L4, L5 and S1 nerve roots along with the common dural tube. There was densely adherent spondylytic material compressing the thecal sac and both L4 and L5 nerve roots.  Decompression was greater than would be typically performed for simple interbody fusion. A thorough discectomy and preparation of the endplates was performed at both the L4/5 and L5/S1 levels on the left.  There was a large spondylotic ridge compressing the L 5 nerve root on the left at the L 5 S 1 level and all neural elements were thoroughly decompressed.  The interspaces were packed with  Extra extra small BMP and 10 cc autograft at each level.  At L 5 S 1, a 10 x 11 x 26 mm expandable TLIF cage opened to 20 degrees was placed.  At the L 45 level, an 22 x 11 x 26 mm cage was placed and opened to 20 degrees lordosis. The posterolateral region was extensively decorticated and pedicle probes were placed at L4, L5 and S1 bilaterally. Intraoperative fluoroscopy confirmed correct orientationin the AP and lateral plane. 40 x 6.5 mm pedicle screws were placed at S1 bilaterally and 40 x 6.5 mm screws placed at L5 bilaterally and 45 x 6.30mm screws were placed at L4 bilaterally. Final x-rays demonstrated well-positioned interbody grafts and pedicle screw fixation. 65 mm rods was placed on the left and a 70 mm rod was placed on the right and locked down in  situ and the posterolateral region was packed with the remaining bone autograft graft  on the right and the remaining BMP and 40 cc allograft bone chips.Long-acting Marcaine was injected in the deep musculature.   Fascia was closed with 1 Vicryl sutures skin edges were reapproximated 2 and 3-0 Vicryl sutures. The wound is dressed with Dermabond and an occlusive dressing. The patient  was extubated in the operating room and taken to recovery in stable satisfactory condition having tolerated the operation well. Counts were correct at the end of the case.    PLAN OF CARE: Admit to inpatient   PATIENT DISPOSITION:  PACU - hemodynamically stable.   Delay start of Pharmacological VTE agent (>24hrs) due to surgical blood loss or risk of bleeding: yes

## 2020-01-12 NOTE — Transfer of Care (Signed)
Immediate Anesthesia Transfer of Care Note  Patient: Cheyenne Brown  Procedure(s) Performed: Left Lumbar Four-Five Lumbar Five Sacral One Transforaminal lumbar interbody fusion (Left Spine Lumbar)  Patient Location: PACU  Anesthesia Type:General  Level of Consciousness: awake, alert , oriented and patient cooperative  Airway & Oxygen Therapy: Patient Spontanous Breathing and Patient connected to nasal cannula oxygen  Post-op Assessment: Report given to RN and Post -op Vital signs reviewed and stable  Post vital signs: Reviewed and stable  Last Vitals:  Vitals Value Taken Time  BP 117/82 01/12/20 1108  Temp 36.6 C 01/12/20 1109  Pulse 89 01/12/20 1111  Resp 14 01/12/20 1111  SpO2 100 % 01/12/20 1111  Vitals shown include unvalidated device data.  Last Pain:  Vitals:   01/12/20 0659  TempSrc:   PainSc: 10-Worst pain ever      Patients Stated Pain Goal: 5 (01/12/20 0659)  Complications: No complications documented.

## 2020-01-12 NOTE — Op Note (Signed)
01/12/2020  11:03 AM  PATIENT:  Cheyenne Brown  59 y.o. female  PRE-OPERATIVE DIAGNOSIS:  Disc displacement, Lumbar, lumbar stenosis, spondylosis, radiculopathy, lumbago L 45 and L 5 S 1 levels  POST-OPERATIVE DIAGNOSIS:   Disc displacement, Lumbar, lumbar stenosis, spondylosis, radiculopathy, lumbago L 45 and L 5 S 1 levels  PROCEDURE:  Procedure(s) with comments: Left Lumbar Four-Five Lumbar Five Sacral One Transforaminal lumbar interbody fusion (Left) - posterior with expandable titanium cages, autograft, pedicle screw fixation, posterolateral arthrodesis  SURGEON:  Surgeon(s) and Role:    Maeola Harman, MD - Primary  PHYSICIAN ASSISTANT:McDaniel, NP   ASSISTANTS: Poteat, RN   ANESTHESIA:   general  EBL:  300 mL   BLOOD ADMINISTERED:100 CC CELLSAVER  DRAINS: none   LOCAL MEDICATIONS USED:  MARCAINE    and LIDOCAINE   SPECIMEN:  No Specimen  DISPOSITION OF SPECIMEN:  N/A  COUNTS:  YES  TOURNIQUET:  * No tourniquets in log *  DICTATION: Patient is 59 year old woman with  HNP, spondylosis,  stenosis, DDD, radiculopathy L4/5, L5/S1. She has a severe left leg pain and weakness. It was elected to take her to surgery for decompression and fusion at L4/5 and L5/S1 levels.  The patient had prior discectomies on the left, but had severe stenosis and spondylosis with recurrent disc herniations.  I felt these would best be treated with facetectomies and TLIF fusion from the left.  Procedure: Patient was placed in a prone position on the [place] table after smooth and uncomplicated induction of general endotracheal anesthesia. Her low back was prepped and draped in usual sterile fashion with betadine scrub and DuraPrep after marking relevant anatomy with C arm. Area of incision was infiltrated with local lidocaine. Incision was made to the lumbodorsal fascia was incised and exposure was performed of the L4/5, L5/S1 spinous processes laminae facet joint and transverse processes.  Intraoperative x-ray was obtained which confirmed correct orientation. A total hemi- laminectomy of L4 and L5 was performed with disarticulation of the facet joints at this level and thorough decompression was performed of both L4, L5 and S1 nerve roots along with the common dural tube. There was densely adherent spondylytic material compressing the thecal sac and both L4 and L5 nerve roots.  Decompression was greater than would be typically performed for simple interbody fusion. A thorough discectomy and preparation of the endplates was performed at both the L4/5 and L5/S1 levels on the left.  There was a large spondylotic ridge compressing the L 5 nerve root on the left at the L 5 S 1 level and all neural elements were thoroughly decompressed.  The interspaces were packed with  Extra extra small BMP and 10 cc autograft at each level.  At L 5 S 1, a 10 x 11 x 26 mm expandable TLIF cage opened to 20 degrees was placed.  At the L 45 level, an 22 x 11 x 26 mm cage was placed and opened to 20 degrees lordosis. The posterolateral region was extensively decorticated and pedicle probes were placed at L4, L5 and S1 bilaterally. Intraoperative fluoroscopy confirmed correct orientationin the AP and lateral plane. 40 x 6.5 mm pedicle screws were placed at S1 bilaterally and 40 x 6.5 mm screws placed at L5 bilaterally and 45 x 6.30mm screws were placed at L4 bilaterally. Final x-rays demonstrated well-positioned interbody grafts and pedicle screw fixation. 65 mm rods was placed on the left and a 70 mm rod was placed on the right and locked down in  situ and the posterolateral region was packed with the remaining bone autograft graft  on the right and the remaining BMP and 40 cc allograft bone chips.Long-acting Marcaine was injected in the deep musculature.   Fascia was closed with 1 Vicryl sutures skin edges were reapproximated 2 and 3-0 Vicryl sutures. The wound is dressed with Dermabond and an occlusive dressing. The patient  was extubated in the operating room and taken to recovery in stable satisfactory condition having tolerated the operation well. Counts were correct at the end of the case.    PLAN OF CARE: Admit to inpatient   PATIENT DISPOSITION:  PACU - hemodynamically stable.   Delay start of Pharmacological VTE agent (>24hrs) due to surgical blood loss or risk of bleeding: yes  

## 2020-01-12 NOTE — Anesthesia Postprocedure Evaluation (Signed)
Anesthesia Post Note  Patient: Cheyenne Brown  Procedure(s) Performed: Left Lumbar Four-Five Lumbar Five Sacral One Transforaminal lumbar interbody fusion (Left Spine Lumbar)     Patient location during evaluation: PACU Anesthesia Type: General Level of consciousness: awake Pain management: pain level controlled Vital Signs Assessment: post-procedure vital signs reviewed and stable Respiratory status: spontaneous breathing, nonlabored ventilation, respiratory function stable and patient connected to nasal cannula oxygen Cardiovascular status: blood pressure returned to baseline and stable Postop Assessment: no apparent nausea or vomiting Anesthetic complications: no   No complications documented.  Last Vitals:  Vitals:   01/12/20 1237 01/12/20 1325  BP: 119/88 136/70  Pulse: 89 85  Resp: 18 20  Temp: (!) 36.4 C 36.4 C  SpO2: 100% 100%    Last Pain:  Vitals:   01/12/20 1605  TempSrc:   PainSc: 5                  Aundre Hietala P Sonni Barse

## 2020-01-12 NOTE — Progress Notes (Signed)
Orthopedic Tech Progress Note Patient Details:  Cheyenne Brown [birth year] [id number] Patient has brace. Patient ID: Cheyenne Brown, female   DOB: [birth year], 59 y.o.   MRN: [id number]   Cheyenne Brown 01/12/2020, 1:22 PM

## 2020-01-12 NOTE — Interval H&P Note (Signed)
History and Physical Interval Note:  01/12/2020 7:30 AM  Cheyenne Brown  has presented today for surgery, with the diagnosis of Disc displacement, Lumbar.  The various methods of treatment have been discussed with the patient and family. After consideration of risks, benefits and other options for treatment, the patient has consented to  Procedure(s) with comments: Left Lumbar 4-5 Lumbar 5 Sacral 1 Transforaminal lumbar interbody fusion (Left) - 3C/RM 21 as a surgical intervention.  The patient's history has been reviewed, patient examined, no change in status, stable for surgery.  I have reviewed the patient's chart and labs.  Questions were answered to the patient's satisfaction.     Dorian Heckle

## 2020-01-12 NOTE — Anesthesia Procedure Notes (Signed)
Procedure Name: Intubation Date/Time: 01/12/2020 7:46 AM Performed by: Waynard Edwards, CRNA Pre-anesthesia Checklist: Patient identified, Emergency Drugs available, Patient being monitored and Suction available Patient Re-evaluated:Patient Re-evaluated prior to induction Oxygen Delivery Method: Circle system utilized Preoxygenation: Pre-oxygenation with 100% oxygen Induction Type: IV induction Ventilation: Mask ventilation without difficulty Laryngoscope Size: Miller and 2 Grade View: Grade I Tube type: Oral Tube size: 7.0 mm Number of attempts: 1 Airway Equipment and Method: Stylet Placement Confirmation: ETT inserted through vocal cords under direct vision,  positive ETCO2 and breath sounds checked- equal and bilateral Secured at: 22 cm Tube secured with: Tape Dental Injury: Teeth and Oropharynx as per pre-operative assessment

## 2020-01-13 ENCOUNTER — Encounter (HOSPITAL_COMMUNITY): Payer: Self-pay | Admitting: Neurosurgery

## 2020-01-13 MED ORDER — CYCLOBENZAPRINE HCL 10 MG PO TABS
10.0000 mg | ORAL_TABLET | Freq: Three times a day (TID) | ORAL | Status: DC | PRN
  Administered 2020-01-13: 10 mg via ORAL
  Filled 2020-01-13: qty 1

## 2020-01-13 NOTE — Discharge Summary (Signed)
Physician Discharge Summary  Patient ID: Cheyenne Brown MRN: [id number] DOB/AGE: 04-22-1960 59 y.o.  Admit date: 01/12/2020 Discharge date: 01/13/2020  Admission Diagnoses: Disc displacement, Lumbar, lumbar stenosis, spondylosis, radiculopathy, lumbago L 45 and L 5 S 1 levels  Discharge Diagnoses: Disc displacement, Lumbar, lumbar stenosis, spondylosis, radiculopathy, lumbago L 45 and L 5 S 1 levels  Active Problems:   Herniated lumbar disc without myelopathy   Discharged Condition: good  Hospital Course: The patient was admitted on 01/12/2020 and taken to the operating room where the patient underwent a left L4-5 and left L5-S1 TLIF with pedicle screw fixation. The patient tolerated the procedure well and was taken to the recovery room and then to the floor in stable condition. The hospital course was routine. There were no complications. The wound remained clean dry and intact. Pt had appropriate back soreness. No complaints of leg pain or new N/T/W. The patient remained afebrile with stable vital signs, and tolerated a regular diet. The patient continued to increase activities, and pain was well controlled with oral pain medications.  Consults: None  Significant Diagnostic Studies: radiology: X-Ray  Treatments: surgery: Left Lumbar Four-Five Lumbar Five Sacral One Transforaminal lumbar interbody fusion (Left) - posterior with expandable titanium cages, autograft, pedicle screw fixation, posterolateral arthrodesis  Discharge Exam: Blood pressure 138/79, pulse 79, temperature 97.7 F (36.5 C), temperature source Oral, resp. rate 16, height 5\' 6"  (1.676 m), weight 51.7 kg, SpO2 100 %.  Physical Exam: She is A/O X4, conversant, and in good spirits. Reports only mild incisional discomfort. MAEW with good strength that is symmetric bilaterally. Dressing is CDI. Incision is well approximated with no erythema, edema, or drainage.   Disposition: Discharge disposition: 01-Home or Self Care         Allergies as of 01/13/2020   No Known Allergies     Medication List    STOP taking these medications   Flexeril 10 MG tablet Generic drug: cyclobenzaprine   HYDROcodone-acetaminophen 10-325 MG tablet Commonly known as: NORCO   ibuprofen 200 MG tablet Commonly known as: ADVIL     TAKE these medications   ALPRAZolam 1 MG tablet Commonly known as: XANAX Take 1 mg by mouth 3 (three) times daily as needed for anxiety.   amLODipine 5 MG tablet Commonly known as: NORVASC Take 5 mg by mouth daily.   gabapentin 300 MG capsule Commonly known as: NEURONTIN Take 300 mg by mouth 3 (three) times daily.   linaclotide 290 MCG Caps capsule Commonly known as: Linzess Take 1 capsule (290 mcg total) by mouth daily. On empty stomach. What changed: when to take this   methocarbamol 500 MG tablet Commonly known as: ROBAXIN Take 1 tablet (500 mg total) by mouth every 6 (six) hours as needed for muscle spasms.   omeprazole 20 MG capsule Commonly known as: PRILOSEC TAKE ONE CAPSULE BY MOUTH ONCE DAILY.        Signed: 13/07/2019, DNP, NP-C 01/13/2020, 8:51 AM

## 2020-01-13 NOTE — Evaluation (Signed)
Occupational Therapy Evaluation Patient Details Name: Cheyenne Brown MRN: [id number] DOB: October 12, 1960 Today's Date: 01/13/2020    History of Present Illness 59 yo female presenting s/p L4-5 TLIF on 11/04 due to persistent Left LE pain. PMH includes: HTN, back sx, cervical sx, and GERD.    Clinical Impression   PTA, pt was living with her husband and was independent; using SPC as needed for leg pain. Currently, pt performing for ADLs and functional mobility at Supervision level. Provided education and handout on back precautions, bed mobility, brace management, grooming, UB ADLs, LB ADLs, toileting, and tub transfer; pt demonstrated understanding. Answered all pt questions. Recommend dc home once medically stable per physician. All acute OT needs met and will sign off. Thank you.    Follow Up Recommendations  No OT follow up    Equipment Recommendations  None recommended by OT    Recommendations for Other Services PT consult     Precautions / Restrictions Precautions Precautions: Back;Fall Precaution Booklet Issued: Yes (comment) Precaution Comments: Pt recalling back precautions. Reviewed compensatory techniques for ADLs Required Braces or Orthoses: Spinal Brace Spinal Brace: Lumbar corset;Applied in sitting position Restrictions Weight Bearing Restrictions: No      Mobility Bed Mobility Overal bed mobility: Needs Assistance Bed Mobility: Rolling;Sidelying to Sit;Sit to Sidelying Rolling: Supervision Sidelying to sit: Supervision     Sit to sidelying: Supervision General bed mobility comments: Pt demonstrating log roll technique    Transfers Overall transfer level: Needs assistance Equipment used: None Transfers: Sit to/from Stand Sit to Stand: Supervision         General transfer comment: Supervision for safety    Balance                                           ADL either performed or assessed with clinical judgement   ADL Overall  ADL's : Needs assistance/impaired                                       General ADL Comments: Pt performing ADLs and functional mobility at Supervision level. Providing education on back precautions, UB ADLs, LB ADLs, grooming, toileting, and shower transfer. Pt demonstrated understanding     Vision         Perception     Praxis      Pertinent Vitals/Pain Pain Assessment: Faces Faces Pain Scale: Hurts a little bit Pain Location: back Pain Descriptors / Indicators: Grimacing;Discomfort;Sore Pain Intervention(s): Monitored during session;Repositioned     Hand Dominance Right   Extremity/Trunk Assessment Upper Extremity Assessment Upper Extremity Assessment: Overall WFL for tasks assessed   Lower Extremity Assessment Lower Extremity Assessment: Defer to PT evaluation   Cervical / Trunk Assessment Cervical / Trunk Assessment: Other exceptions Cervical / Trunk Exceptions: s/p back surgery   Communication Communication Communication: No difficulties   Cognition Arousal/Alertness: Awake/alert Behavior During Therapy: WFL for tasks assessed/performed Overall Cognitive Status: Within Functional Limits for tasks assessed                                     General Comments  VSS on RA    Exercises     Shoulder Instructions      Home Living Family/patient expects  to be discharged to:: Private residence Living Arrangements: Spouse/significant other Available Help at Discharge: Family;Available 24 hours/day (for 1.5 weeks) Type of Home: House Home Access: Stairs to enter [employer] of Steps: 2 Entrance Stairs-Rails: None Home Layout: One level     Bathroom Shower/Tub: Teacher, early years/pre: Standard     Home Equipment: Environmental consultant - 2 wheels;Cane - single point;Bedside commode;Shower seat;Grab bars - tub/shower;Hand held shower head   Additional Comments: previously using cane for mobility      Prior  Functioning/Environment Level of Independence: Independent with assistive device(s)        Comments: pt reports independent with use of cane, still working until [employer] prior to surgery thursday        OT Problem List: Decreased activity tolerance;Decreased knowledge of use of DME or AE;Decreased knowledge of precautions;Pain;Decreased range of motion      OT Treatment/Interventions:      OT Goals(Current goals can be found in the care plan section) Acute Rehab OT Goals Patient Stated Goal: return home OT Goal Formulation: All assessment and education complete, DC therapy  OT Frequency:     Barriers to D/C:            Co-evaluation              AM-PAC OT "6 Clicks" Daily Activity     Outcome Measure Help from another person eating meals?: None Help from another person taking care of personal grooming?: None Help from another person toileting, which includes using toliet, bedpan, or urinal?: None Help from another person bathing (including washing, rinsing, drying)?: None Help from another person to put on and taking off regular upper body clothing?: None Help from another person to put on and taking off regular lower body clothing?: None 6 Click Score: 24   End of Session Equipment Utilized During Treatment: Back brace Nurse Communication: Mobility status  Activity Tolerance: Patient tolerated treatment well Patient left: in chair;with call bell/phone within reach  OT Visit Diagnosis: Unsteadiness on feet (R26.81);Other abnormalities of gait and mobility (R26.89);Muscle weakness (generalized) (M62.81);Pain Pain - part of body:  (Back)                Time: 0689-3406 OT Time Calculation (min): 19 min Charges:  OT General Charges $OT Visit: 1 Visit OT Evaluation $OT Eval Low Complexity: 1 Low  Sheccid Lahmann MSOT, OTR/L Acute Rehab Pager: [phone] Office: [place] 01/13/2020, 9:02 AM

## 2020-01-13 NOTE — Progress Notes (Signed)
Patient is discharged from room 3C04 at this time. Alert and in stable condition. IV site d/c'd and instructions read to patient and spouse with understanding verbalize and all questions answered. Left unit via wheelchair with all belongings at side.

## 2020-01-13 NOTE — Discharge Instructions (Signed)

## 2020-01-13 NOTE — Progress Notes (Signed)
Physical Therapy Treatment Patient Details Name: Cheyenne Brown MRN: [id number] DOB: March 05, 1961 Today's Date: 01/13/2020    History of Present Illness 59 yo female presenting s/p L4-5 TLIF on 11/04 due to persistent Left LE pain. PMH includes: HTN, back sx, cervical sx, and GERD.     PT Comments    Pt tolerates treatment well, not requiring physical assistance for mobility at this time. Pt demonstrates improved stair negotiation quality and is able to ambulate at a supervision level at this time. Pt will benefit from continued acute PT POC to improve dynamic balance and gait quality while also improving activity tolerance. PT continues to recommend HHPT at the time of discharge.   Follow Up Recommendations  Home health PT;Supervision for mobility/OOB     Equipment Recommendations  None recommended by PT (pt owns necessary DME)    Recommendations for Other Services       Precautions / Restrictions Precautions Precautions: Back;Fall Precaution Booklet Issued: No (precaution booklet present in room upon PT arrival) Precaution Comments: pt able to recall 3/3 back precautions Required Braces or Orthoses: Spinal Brace Spinal Brace: Lumbar corset (on upon arrival) Restrictions Weight Bearing Restrictions: No    Mobility  Bed Mobility Overal bed mobility: Needs Assistance Bed Mobility: Rolling;Sidelying to Sit;Sit to Sidelying Rolling: Supervision Sidelying to sit: Supervision     Sit to sidelying: Supervision General bed mobility comments: pt declined attempts at bed mobility, reports good knowledge of log roll technique  Transfers Overall transfer level: Independent Equipment used: None Transfers: Sit to/from Stand Sit to Stand: Independent         General transfer comment: Supervision for safety  Ambulation/Gait Ambulation/Gait assistance: Supervision Gait Distance (Feet): 150 Feet Assistive device: None Gait Pattern/deviations: Step-through pattern Gait  velocity: reduced Gait velocity interpretation: 1.31 - 2.62 ft/sec, indicative of limited community ambulator General Gait Details: pt with slow but steady step through gait, reduced stride length   Stairs Stairs: Yes Stairs assistance: Supervision Stair Management: One rail Right;Forwards Number of Stairs: 2     Wheelchair Mobility    Modified Rankin (Stroke Patients Only)       Balance Overall balance assessment: Needs assistance Sitting-balance support: No upper extremity supported;Feet supported Sitting balance-Leahy Scale: Good     Standing balance support: No upper extremity supported;During functional activity Standing balance-Leahy Scale: Good                              Cognition Arousal/Alertness: Awake/alert Behavior During Therapy: WFL for tasks assessed/performed Overall Cognitive Status: Within Functional Limits for tasks assessed                                        Exercises      General Comments General comments (skin integrity, edema, etc.): VSS on RA      Pertinent Vitals/Pain Pain Assessment: Faces Faces Pain Scale: Hurts a little bit Pain Location: back Pain Descriptors / Indicators: Grimacing;Discomfort;Sore Pain Intervention(s): Monitored during session    Home Living Family/patient expects to be discharged to:: Private residence Living Arrangements: Spouse/significant other Available Help at Discharge: Family;Available 24 hours/day (for 1.5 weeks) Type of Home: House Home Access: Stairs to enter Entrance Stairs-Rails: None Home Layout: One level Home Equipment: Environmental consultant - 2 wheels;Cane - single point;Bedside commode;Shower seat;Grab bars - tub/shower;Hand held shower head Additional Comments: previously using cane for  mobility    Prior Function Level of Independence: Independent with assistive device(s)      Comments: pt reports independent with use of cane, still working until [employer] prior to  surgery thursday   PT Goals (current goals can now be found in the care plan section) Acute Rehab PT Goals Patient Stated Goal: return home Progress towards PT goals: Progressing toward goals    Frequency    Min 5X/week      PT Plan Current plan remains appropriate    Co-evaluation              AM-PAC PT "6 Clicks" Mobility   Outcome Measure  Help needed turning from your back to your side while in a flat bed without using bedrails?: A Little Help needed moving from lying on your back to sitting on the side of a flat bed without using bedrails?: A Little Help needed moving to and from a bed to a chair (including a wheelchair)?: None Help needed standing up from a chair using your arms (e.g., wheelchair or bedside chair)?: None Help needed to walk in hospital room?: None Help needed climbing 3-5 steps with a railing? : None 6 Click Score: 22    End of Session Equipment Utilized During Treatment: Back brace Activity Tolerance: Patient tolerated treatment well Patient left: in chair;with call bell/phone within reach Nurse Communication: Mobility status PT Visit Diagnosis: Other abnormalities of gait and mobility (R26.89);Pain Pain - part of body:  (back)     Time: 5053-9767 PT Time Calculation (min) (ACUTE ONLY): 12 min  Charges:  $Gait Training: 8-22 mins                     Arlyss Gandy, PT, DPT Acute Rehabilitation Pager: [phone]    Arlyss Gandy 01/13/2020, 9:37 AM

## 2020-01-13 NOTE — Progress Notes (Signed)
CSW received home health referral. Referral declined by [facility], [facility], [facility], [facility], [facility], [facility], [facility]. CSW will complete outpatient PT referral.   Cristobal Goldmann LCSW

## 2020-01-13 NOTE — Progress Notes (Signed)
Subjective: Patient reports that she has a mild headache but otherwise is doing well.  Objective: Vital signs in last 24 hours: Temp:  [97.5 F (36.4 C)-98.2 F (36.8 C)] 97.7 F (36.5 C) (11/05 0746) Pulse Rate:  [73-91] 79 (11/05 0746) Resp:  [11-20] 16 (11/05 0746) BP: (117-139)/(70-105) 138/79 (11/05 0746) SpO2:  [99 %-100 %] 100 % (11/05 0746)  Intake/Output from previous day: 11/04 0701 - 11/05 0700 In: 2460 [P.O.:480; I.V.:1400; Blood:130; IV Piggyback:450] Out: 545 [Urine:245; Blood:300] Intake/Output this shift: No intake/output data recorded.  Physical Exam: She is A/O X4, conversant, and in good spirits. Reports only mild incisional discomfort. MAEW with good strength that is symmetric bilaterally. Dressing is CDI. Incision is well approximated with no erythema, edema, or drainage.   Lab Results: Recent Labs    01/10/20 1333  WBC 6.8  HGB 11.0*  HCT 36.5  PLT 217   BMET Recent Labs    01/10/20 1333  NA 141  K 3.8  CL 106  CO2 27  GLUCOSE 100*  BUN 7*  CREATININE 0.61  CALCIUM 9.4    Studies/Results: DG Lumbar Spine 2-3 Views  Result Date: 01/12/2020 CLINICAL DATA:  Lumbosacral fusion EXAM: LUMBAR SPINE - 2-3 VIEW; DG C-ARM 1-60 MIN COMPARISON:  12/09/2018 lumbar spine MR dated 11/10/2019. FINDINGS: Multiple PA and lateral C-arm images of the lumbosacral region were obtained. These demonstrate metallic interbody spacers at the L4-5 and L5-S1 levels and placement of bilateral pedicle screws at the L4, L5 and S1 levels. Normal alignment. IMPRESSION: Operative changes, as described above. Electronically Signed   By: Beckie Salts M.D.   On: 01/12/2020 10:54   DG C-Arm 1-60 Min  Result Date: 01/12/2020 CLINICAL DATA:  Lumbosacral fusion EXAM: LUMBAR SPINE - 2-3 VIEW; DG C-ARM 1-60 MIN COMPARISON:  12/09/2018 lumbar spine MR dated 11/10/2019. FINDINGS: Multiple PA and lateral C-arm images of the lumbosacral region were obtained. These demonstrate metallic  interbody spacers at the L4-5 and L5-S1 levels and placement of bilateral pedicle screws at the L4, L5 and S1 levels. Normal alignment. IMPRESSION: Operative changes, as described above. Electronically Signed   By: Beckie Salts M.D.   On: 01/12/2020 10:54    Assessment/Plan: Patient is post-op day 1 s/p L a left L4-5 and left L5-S1 TLIF with pedicle screw fixation. She is recovering well and reports a resolution of her preoperative symptoms.  Her only complaint is a mild headache that is responding well to Tylenol.  She is awaiting PT and OT evaluation.  Continue LSO brace when OOB. Continue working on pain control, mobility and ambulating patient. Will plan for discharge today.    LOS: 1 day    Council Mechanic, DNP, NP-C 01/13/2020, 8:35 AM

## 2020-01-16 MED FILL — Sodium Chloride IV Soln 0.9%: INTRAVENOUS | Qty: 1000 | Status: AC

## 2020-01-16 MED FILL — Heparin Sodium (Porcine) Inj 1000 Unit/ML: INTRAMUSCULAR | Qty: 30 | Status: AC

## 2020-01-26 ENCOUNTER — Other Ambulatory Visit (HOSPITAL_COMMUNITY): Payer: Self-pay | Admitting: Family Medicine

## 2020-01-26 DIAGNOSIS — Z1231 Encounter for screening mammogram for malignant neoplasm of breast: Secondary | ICD-10-CM

## 2020-02-01 ENCOUNTER — Ambulatory Visit (HOSPITAL_COMMUNITY): Payer: PRIVATE HEALTH INSURANCE | Admitting: Physical Therapy

## 2020-02-09 ENCOUNTER — Ambulatory Visit (HOSPITAL_COMMUNITY)
Admission: RE | Admit: 2020-02-09 | Discharge: 2020-02-09 | Disposition: A | Discharge status: Home / Self Care | Payer: PRIVATE HEALTH INSURANCE | Source: Ambulatory Visit | Attending: Family Medicine | Admitting: Family Medicine

## 2020-02-09 ENCOUNTER — Other Ambulatory Visit: Payer: Self-pay

## 2020-02-09 DIAGNOSIS — Z1231 Encounter for screening mammogram for malignant neoplasm of breast: Secondary | ICD-10-CM | POA: Diagnosis not present

## 2020-02-14 ENCOUNTER — Telehealth (HOSPITAL_COMMUNITY): Payer: Self-pay | Admitting: Physical Therapy

## 2020-02-14 ENCOUNTER — Ambulatory Visit (HOSPITAL_COMMUNITY): Payer: PRIVATE HEALTH INSURANCE | Admitting: Physical Therapy

## 2020-02-14 NOTE — Telephone Encounter (Signed)
pt called to cx this appt and reschedule due to an md appt+

## 2020-02-15 ENCOUNTER — Ambulatory Visit (HOSPITAL_COMMUNITY): Payer: PRIVATE HEALTH INSURANCE | Attending: Neurosurgery | Admitting: Physical Therapy

## 2020-02-15 ENCOUNTER — Encounter (HOSPITAL_COMMUNITY): Payer: Self-pay | Admitting: Physical Therapy

## 2020-02-15 ENCOUNTER — Other Ambulatory Visit: Payer: Self-pay

## 2020-02-15 DIAGNOSIS — M545 Low back pain, unspecified: Secondary | ICD-10-CM | POA: Diagnosis present

## 2020-02-15 NOTE — Therapy (Signed)
[facility] [address] [place], [place], [address] Phone: [phone]   Fax:  [phone]  Physical Therapy Evaluation  Patient Details  Name: Cheyenne Brown MRN: [id number] Date of Birth: 1960-07-23 Referring Provider (PT): Maeola Harman MD    Encounter Date: 02/15/2020   PT End of Session - 02/15/20 1748    Visit Number 1    Number of Visits 1    Authorization Type [employer]    PT Start Time 0315    PT Stop Time 0405    PT Time Calculation (min) 50 min    Activity Tolerance Patient tolerated treatment well    Behavior During Therapy [facility] for tasks assessed/performed           Past Medical History:  Diagnosis Date  . Anemia    presumed related to metromenorrhagia  . Arthritis   . Constipation   . GERD (gastroesophageal reflux disease)   . Head trauma 1996   depressed skull fracture and epidural hematoma following below with a blunt object; surgery and prolonged rehabilitation required  . Headache    migraines  . History of tobacco abuse     15 pack years; discontinued 2008  . Hypertension   . Menometrorrhagia   . PUD (peptic ulcer disease)    per pt report, likely secondary to [facility] powders    Past Surgical History:  Procedure Laterality Date  . BRAIN SURGERY    . COLONOSCOPY  10/30/2010   RMR: Anal papilla, otherwise normal rectum and colon (melanosis coli)  . crainiotomy  1996  . ESOPHAGOGASTRODUODENOSCOPY  10/30/2010   RMR: Normal tubular esophagus. Staus post of maloney dilation as described above. . Normal stomach, duodenum through the second portion  . LUMBAR LAMINECTOMY/DECOMPRESSION MICRODISCECTOMY Left 12/09/2018   Procedure: Left Lumbar Four-Five Lumbar Five-Sacral One Microdiscectomy;  Surgeon: Maeola Harman, MD;  Location: [facility] OR;  Service: Neurosurgery;  Laterality: Left;  Left Lumbar Four-Five Lumbar Five-Sacral One Microdiscectomy  . MALONEY DILATION  10/30/2010   Procedure: Elease Hashimoto DILATION;  Surgeon: Corbin Ade, MD;  [facility];  Service: Endoscopy;  Laterality: N/A;  . NECK SURGERY  2008 & 2011   x2  . TRANSFORAMINAL LUMBAR INTERBODY FUSION (TLIF) WITH PEDICLE SCREW FIXATION 2 LEVEL Left 01/12/2020   Procedure: Left Lumbar Four-Five Lumbar Five Sacral One Transforaminal lumbar interbody fusion;  Surgeon: Maeola Harman, MD;  Location: [facility] OR;  Service: Neurosurgery;  Laterality: Left;  posterior    There were no vitals filed for this visit.    Subjective Assessment - 02/15/20 1530    Subjective Patient presents to physical therapy with complaint of low back pain. Patient reports long standing history of low back issues. She states she has had 5 back surgeries, most recent was lumbar TLIF on 01/12/20. Patient says she is doing better since then but is still having some issues with pain and weakness. Patient states she is having some issues regarding a change of insurance with her work and requests that today be 1 x Eval only for home program development.    Limitations Lifting;Walking;Standing;House hold activities    Patient Stated Goals Get some exercises for my back    Currently in Pain? Yes    Pain Score 7     Pain Location Back    Pain Orientation Lower;Posterior    Pain Descriptors / Indicators Aching    Pain Type Surgical pain    Pain Onset More than a month ago  Pain Frequency Constant    Aggravating Factors  sitting too long, standing too long    Pain Relieving Factors Patch, walking, meds    Effect of Pain on Daily Activities Limits              [facility] PT Assessment - 02/15/20 0001      Assessment   Medical Diagnosis LBP s/p Lumbar TLIF    Referring Provider (PT) Maeola Harman MD     Onset Date/Surgical Date 01/12/20    Prior Therapy Yes       Precautions   Precautions Other (comment)    Precaution Comments no bending, lifting (5lb), twisting     Required Braces or Orthoses Other Brace/Splint   lumbar back brace     Restrictions   Weight Bearing  Restrictions No      Balance Screen   Has the patient fallen in the past 6 months No      Home Environment   Living Environment Private residence    Living Arrangements Spouse/significant other      Prior Function   Level of Independence Independent with basic ADLs      Cognition   Overall Cognitive Status Within Functional Limits for tasks assessed      Observation/Other Assessments   Observations Slouched seated posture. Dons lumbar stability brace       Sensation   Light Touch Appears Intact      ROM / Strength   AROM / PROM / Strength AROM;Strength      AROM   Overall AROM Comments Lumbar AROM not assessed per surgical precautions      Strength   Strength Assessment Site Hip;Knee;Ankle    Right/Left Hip Right;Left    Right Hip Flexion 4+/5    Left Hip Flexion 4-/5    Right/Left Knee Right;Left    Right Knee Extension 4+/5    Left Knee Extension 4-/5    Right/Left Ankle Right;Left    Right Ankle Dorsiflexion 5/5    Left Ankle Dorsiflexion 5/5                      Objective measurements completed on examination: See above findings.       [facility] Adult PT Treatment/Exercise - 02/15/20 0001      Exercises   Exercises Lumbar      Lumbar Exercises: Supine   Ab Set 10 reps;5 seconds    Bent Knee Raise 10 reps    Bridge 5 reps    Straight Leg Raise 10 reps    Straight Leg Raises Limitations x10 each     Other Supine Lumbar Exercises iso hip abduction/ adduction 10 x 5" each                   PT Education - 02/15/20 1536    Education Details on evaluation findings POC and HEP    Person(s) Educated Patient    Methods Explanation;Handout    Comprehension Verbalized understanding            PT Short Term Goals - 02/15/20 1751      PT SHORT TERM GOAL #1   Title NA    Baseline 1 x eval only             PT Long Term Goals - 02/15/20 1751      PT LONG TERM GOAL #1   Title NA  Plan - 02/15/20 1749     Clinical Impression Statement Patient is a 59 y.o. female who presents to physical therapy with complaint of LBP s/p lumbar TLIF on 01/12/20. Patient presents decreased strength, ROM restriction, reduced perceived functional ability and postural deficits which are likely contributing to symptoms of pain and are negatively impacting patient ability to perform ADLs and functional mobility tasks. At this time, patient requests 1 x eval only for HEP development due to financial reasons. Educated patient on comprehensive HEP and issued handout. Educated patient on purpose and function of all exercise and appropriate time frames for exercise progressions. Also reviewed current post op restrictions. Answered all patient questions. Patient instructed to follow up with therapy services with any further questions or concerns.    Examination-Activity Limitations Locomotion Level;Lift;Stand;Transfers    Examination-Participation Restrictions Laundry;Community Activity;Cleaning    Stability/Clinical Decision Making Stable/Uncomplicated    Clinical Decision Making Low    Rehab Potential Good    PT Frequency 1x / week    PT Duration --   1 x eval only   PT Treatment/Interventions Therapeutic exercise;Therapeutic activities;Patient/family education    PT Next Visit Plan 1 x eval only    PT Home Exercise Plan Issued at [facility] and Agree with Plan of Care Patient           Patient will benefit from skilled therapeutic intervention in order to improve the following deficits and impairments:  Pain, Improper body mechanics, Increased fascial restricitons, Decreased activity tolerance, Decreased range of motion, Decreased strength, Hypomobility, Impaired flexibility, Postural dysfunction  Visit Diagnosis: Low back pain, unspecified back pain laterality, unspecified chronicity, unspecified whether sciatica present     Problem List Patient Active Problem List   Diagnosis Date Noted  . Herniated  lumbar disc without myelopathy 12/09/2018  . Constipation 12/20/2014  . Dysphagia 10/24/2010  . Special screening for malignant neoplasms, colon 10/24/2010  . Chest pain, atypical 10/16/2010  . Syncope 05/29/2010  . History of tobacco abuse 01/23/2006  . GERD 01/23/2006  . HEAD TRAUMA, HX OF 01/23/2006    5:57 PM, 02/15/20 Georges Lynch PT DPT  Physical Therapist with [place]  [facility]  [phone]   [facility] [address] [place], [place], [address] Phone: [phone]   Fax:  [phone]  Name: Cheyenne Brown MRN: [id number] Date of Birth: October 15, 1960

## 2020-02-15 NOTE — Patient Instructions (Signed)
Access Code: [email or web address] URL: [email or web address] Date: 02/15/2020 Prepared by: Georges Lynch  Exercises Supine Transversus Abdominis Bracing - Hands on Stomach - 1-2 x daily - 7 x weekly - 2 sets - 10 reps - 5 seconds hold Supine March - 1-2 x daily - 7 x weekly - 2 sets - 10 reps Hooklying Isometric Hip Abduction with Belt - 1-2 x daily - 7 x weekly - 2 sets - 10 reps - seconds hold Supine Hip Adduction Isometric with Ball - 1-2 x daily - 7 x weekly - 2 sets - 10 reps - seconds hold Active Straight Leg Raise with Quad Set - 1-2 x daily - 7 x weekly - 2 sets - 10 reps Supine Bridge - 1-2 x daily - 7 x weekly - 2 sets - 10 reps Supine Lower Trunk Rotation - 1-2 x daily - 7 x weekly - 1 sets - 10 reps - 10 seconds hold

## 2020-04-16 ENCOUNTER — Telehealth: Payer: Self-pay | Admitting: Internal Medicine

## 2020-04-16 NOTE — Telephone Encounter (Signed)
[phone]  PATIENT WOULD LIKE A COUPON FOR LINZESS IF WE HAVE ANY   SHE CAN NOT AFFORD IT WITHOUT ONE AND SHE IS OUT

## 2020-04-16 NOTE — Telephone Encounter (Signed)
Spoke with pt. Pts PCP has been writing the script for Linzess for pt. I asked her to ask her PCP office about applying for PT assistance. Pt hasn't been seen in our office in 5 years and they should be able to assist with that process. Pt lost her insurance and if she isn't able to have her PCP office do pt assistance, pt is going to apply for cone assistance and schedule an apt.

## 2020-10-01 ENCOUNTER — Encounter: Payer: Self-pay | Admitting: *Deleted

## 2021-03-26 ENCOUNTER — Other Ambulatory Visit (HOSPITAL_COMMUNITY): Payer: Self-pay | Admitting: Family Medicine

## 2021-03-26 DIAGNOSIS — Z1231 Encounter for screening mammogram for malignant neoplasm of breast: Secondary | ICD-10-CM

## 2021-04-08 ENCOUNTER — Ambulatory Visit (HOSPITAL_COMMUNITY): Payer: PRIVATE HEALTH INSURANCE

## 2021-04-10 ENCOUNTER — Ambulatory Visit (HOSPITAL_COMMUNITY): Payer: PRIVATE HEALTH INSURANCE

## 2021-04-10 ENCOUNTER — Encounter (HOSPITAL_COMMUNITY): Payer: Self-pay

## 2021-04-18 DIAGNOSIS — R69 Illness, unspecified: Secondary | ICD-10-CM | POA: Diagnosis not present

## 2021-04-18 DIAGNOSIS — I10 Essential (primary) hypertension: Secondary | ICD-10-CM | POA: Diagnosis not present

## 2021-04-24 ENCOUNTER — Other Ambulatory Visit: Payer: Self-pay

## 2021-04-24 ENCOUNTER — Ambulatory Visit (HOSPITAL_COMMUNITY)
Admission: RE | Admit: 2021-04-24 | Discharge: 2021-04-24 | Disposition: A | Discharge status: Home / Self Care | Payer: 59 | Source: Ambulatory Visit | Attending: Family Medicine | Admitting: Family Medicine

## 2021-04-24 DIAGNOSIS — Z1231 Encounter for screening mammogram for malignant neoplasm of breast: Secondary | ICD-10-CM | POA: Insufficient documentation

## 2021-06-03 DIAGNOSIS — M5416 Radiculopathy, lumbar region: Secondary | ICD-10-CM | POA: Diagnosis not present

## 2021-07-01 ENCOUNTER — Other Ambulatory Visit: Payer: Self-pay | Admitting: Neurosurgery

## 2021-07-01 ENCOUNTER — Other Ambulatory Visit (HOSPITAL_COMMUNITY): Payer: Self-pay | Admitting: Neurosurgery

## 2021-07-01 DIAGNOSIS — M5416 Radiculopathy, lumbar region: Secondary | ICD-10-CM

## 2021-07-17 ENCOUNTER — Ambulatory Visit (HOSPITAL_COMMUNITY)
Admission: RE | Admit: 2021-07-17 | Discharge: 2021-07-17 | Disposition: A | Discharge status: Home / Self Care | Payer: 59 | Source: Ambulatory Visit | Attending: Neurosurgery | Admitting: Neurosurgery

## 2021-07-17 DIAGNOSIS — M4807 Spinal stenosis, lumbosacral region: Secondary | ICD-10-CM | POA: Diagnosis not present

## 2021-07-17 DIAGNOSIS — M4317 Spondylolisthesis, lumbosacral region: Secondary | ICD-10-CM | POA: Diagnosis not present

## 2021-07-17 DIAGNOSIS — M5416 Radiculopathy, lumbar region: Secondary | ICD-10-CM | POA: Insufficient documentation

## 2021-07-17 DIAGNOSIS — M5126 Other intervertebral disc displacement, lumbar region: Secondary | ICD-10-CM | POA: Diagnosis not present

## 2021-07-17 DIAGNOSIS — M419 Scoliosis, unspecified: Secondary | ICD-10-CM | POA: Diagnosis not present

## 2021-07-17 MED ORDER — GADOBUTROL 1 MMOL/ML IV SOLN
5.0000 mL | Freq: Once | INTRAVENOUS | Status: AC | PRN
  Administered 2021-07-17: 5 mL via INTRAVENOUS

## 2021-08-02 DIAGNOSIS — M5412 Radiculopathy, cervical region: Secondary | ICD-10-CM | POA: Diagnosis not present

## 2021-08-02 DIAGNOSIS — M545 Low back pain, unspecified: Secondary | ICD-10-CM | POA: Diagnosis not present

## 2021-08-02 DIAGNOSIS — M5416 Radiculopathy, lumbar region: Secondary | ICD-10-CM | POA: Diagnosis not present

## 2021-08-02 DIAGNOSIS — M542 Cervicalgia: Secondary | ICD-10-CM | POA: Diagnosis not present

## 2021-08-26 DIAGNOSIS — M5416 Radiculopathy, lumbar region: Secondary | ICD-10-CM | POA: Diagnosis not present

## 2021-09-02 DIAGNOSIS — I10 Essential (primary) hypertension: Secondary | ICD-10-CM | POA: Diagnosis not present

## 2021-09-02 DIAGNOSIS — Z13220 Encounter for screening for lipoid disorders: Secondary | ICD-10-CM | POA: Diagnosis not present

## 2021-09-04 ENCOUNTER — Other Ambulatory Visit: Payer: Self-pay

## 2021-09-04 ENCOUNTER — Encounter (HOSPITAL_COMMUNITY): Payer: Self-pay | Admitting: Emergency Medicine

## 2021-09-04 ENCOUNTER — Observation Stay (HOSPITAL_COMMUNITY)
Admission: EM | Admit: 2021-09-04 | Discharge: 2021-09-06 | Disposition: A | Discharge status: Home / Self Care | Payer: 59 | Attending: Internal Medicine | Admitting: Internal Medicine

## 2021-09-04 DIAGNOSIS — Z79899 Other long term (current) drug therapy: Secondary | ICD-10-CM | POA: Insufficient documentation

## 2021-09-04 DIAGNOSIS — I10 Essential (primary) hypertension: Secondary | ICD-10-CM | POA: Insufficient documentation

## 2021-09-04 DIAGNOSIS — K922 Gastrointestinal hemorrhage, unspecified: Secondary | ICD-10-CM | POA: Diagnosis not present

## 2021-09-04 DIAGNOSIS — K648 Other hemorrhoids: Secondary | ICD-10-CM | POA: Insufficient documentation

## 2021-09-04 DIAGNOSIS — D62 Acute posthemorrhagic anemia: Secondary | ICD-10-CM | POA: Diagnosis present

## 2021-09-04 DIAGNOSIS — Z87891 Personal history of nicotine dependence: Secondary | ICD-10-CM | POA: Diagnosis not present

## 2021-09-04 DIAGNOSIS — F1120 Opioid dependence, uncomplicated: Secondary | ICD-10-CM | POA: Insufficient documentation

## 2021-09-04 DIAGNOSIS — F419 Anxiety disorder, unspecified: Secondary | ICD-10-CM

## 2021-09-04 DIAGNOSIS — D509 Iron deficiency anemia, unspecified: Secondary | ICD-10-CM

## 2021-09-04 DIAGNOSIS — D649 Anemia, unspecified: Secondary | ICD-10-CM

## 2021-09-04 DIAGNOSIS — R195 Other fecal abnormalities: Secondary | ICD-10-CM | POA: Insufficient documentation

## 2021-09-04 DIAGNOSIS — K31811 Angiodysplasia of stomach and duodenum with bleeding: Secondary | ICD-10-CM | POA: Insufficient documentation

## 2021-09-04 LAB — CBC WITH DIFFERENTIAL/PLATELET
Abs Immature Granulocytes: 0.03 10*3/uL (ref 0.00–0.07)
Basophils Absolute: 0 10*3/uL (ref 0.0–0.1)
Basophils Relative: 1 %
Eosinophils Absolute: 0.2 10*3/uL (ref 0.0–0.5)
Eosinophils Relative: 3 %
HCT: 28.3 % — ABNORMAL LOW (ref 36.0–46.0)
Hemoglobin: 7.8 g/dL — ABNORMAL LOW (ref 12.0–15.0)
Immature Granulocytes: 1 %
Lymphocytes Relative: 35 %
Lymphs Abs: 2.3 10*3/uL (ref 0.7–4.0)
MCH: 20.1 pg — ABNORMAL LOW (ref 26.0–34.0)
MCHC: 27.6 g/dL — ABNORMAL LOW (ref 30.0–36.0)
MCV: 72.8 fL — ABNORMAL LOW (ref 80.0–100.0)
Monocytes Absolute: 0.6 10*3/uL (ref 0.1–1.0)
Monocytes Relative: 8 %
Neutro Abs: 3.5 10*3/uL (ref 1.7–7.7)
Neutrophils Relative %: 52 %
Platelets: 302 10*3/uL (ref 150–400)
RBC: 3.89 MIL/uL (ref 3.87–5.11)
RDW: 18.6 % — ABNORMAL HIGH (ref 11.5–15.5)
WBC: 6.7 10*3/uL (ref 4.0–10.5)
nRBC: 0 % (ref 0.0–0.2)

## 2021-09-04 LAB — BASIC METABOLIC PANEL
Anion gap: 7 (ref 5–15)
BUN: 10 mg/dL (ref 8–23)
CO2: 25 mmol/L (ref 22–32)
Calcium: 8.5 mg/dL — ABNORMAL LOW (ref 8.9–10.3)
Chloride: 110 mmol/L (ref 98–111)
Creatinine, Ser: 0.79 mg/dL (ref 0.44–1.00)
GFR, Estimated: >60 mL/min (ref >60)
Glucose, Bld: 95 mg/dL (ref 70–99)
Potassium: 3.4 mmol/L — ABNORMAL LOW (ref 3.5–5.1)
Sodium: 142 mmol/L (ref 135–145)

## 2021-09-04 LAB — IRON AND TIBC
Iron: 18 ug/dL — ABNORMAL LOW (ref 28–170)
Saturation Ratios: 4 % — ABNORMAL LOW (ref 10.4–31.8)
TIBC: 463 ug/dL — ABNORMAL HIGH (ref 250–450)
UIBC: 445 ug/dL

## 2021-09-04 LAB — VITAMIN B12: Vitamin B-12: 222 pg/mL (ref 180–914)

## 2021-09-04 LAB — FERRITIN: Ferritin: 4 ng/mL — ABNORMAL LOW (ref 11–307)

## 2021-09-04 LAB — FOLATE: Folate: 18.8 ng/mL (ref >5.9)

## 2021-09-04 LAB — POC OCCULT BLOOD, ED: Fecal Occult Bld: POSITIVE — AB

## 2021-09-04 LAB — PREPARE RBC (CROSSMATCH)

## 2021-09-04 MED ORDER — GABAPENTIN 300 MG PO CAPS
300.0000 mg | ORAL_CAPSULE | Freq: Three times a day (TID) | ORAL | Status: DC
  Administered 2021-09-04 – 2021-09-05 (×4): 300 mg via ORAL
  Filled 2021-09-04 (×4): qty 1

## 2021-09-04 MED ORDER — POLYETHYLENE GLYCOL 3350 17 G PO PACK
17.0000 g | PACK | Freq: Every day | ORAL | Status: DC | PRN
Start: 2021-09-04 — End: 2021-09-06

## 2021-09-04 MED ORDER — CYCLOBENZAPRINE HCL 10 MG PO TABS
10.0000 mg | ORAL_TABLET | Freq: Every day | ORAL | Status: DC
  Administered 2021-09-04 – 2021-09-05 (×2): 10 mg via ORAL
  Filled 2021-09-04 (×2): qty 1

## 2021-09-04 MED ORDER — PANTOPRAZOLE SODIUM 40 MG IV SOLR
40.0000 mg | INTRAVENOUS | Status: AC
  Administered 2021-09-04: 40 mg via INTRAVENOUS
  Filled 2021-09-04: qty 10

## 2021-09-04 MED ORDER — AMLODIPINE BESYLATE 5 MG PO TABS
5.0000 mg | ORAL_TABLET | Freq: Every day | ORAL | Status: DC
  Administered 2021-09-05: 5 mg via ORAL
  Filled 2021-09-04: qty 1

## 2021-09-04 MED ORDER — SODIUM CHLORIDE 0.9% IV SOLUTION: Freq: Once | INTRAVENOUS | Status: AC

## 2021-09-04 MED ORDER — PANTOPRAZOLE SODIUM 40 MG IV SOLR
40.0000 mg | INTRAVENOUS | Status: DC
  Administered 2021-09-05: 40 mg via INTRAVENOUS
  Filled 2021-09-04: qty 10

## 2021-09-04 MED ORDER — ONDANSETRON HCL 4 MG PO TABS: 4.0000 mg | ORAL_TABLET | Freq: Four times a day (QID) | ORAL | Status: DC | PRN

## 2021-09-04 MED ORDER — ALPRAZOLAM 1 MG PO TABS
1.0000 mg | ORAL_TABLET | Freq: Three times a day (TID) | ORAL | Status: DC | PRN
  Administered 2021-09-04 – 2021-09-05 (×3): 1 mg via ORAL
  Filled 2021-09-04 (×3): qty 1

## 2021-09-04 MED ORDER — ACETAMINOPHEN 325 MG PO TABS
650.0000 mg | ORAL_TABLET | Freq: Four times a day (QID) | ORAL | Status: DC | PRN
  Administered 2021-09-06: 650 mg via ORAL
  Filled 2021-09-04: qty 2

## 2021-09-04 MED ORDER — POTASSIUM CHLORIDE CRYS ER 20 MEQ PO TBCR
40.0000 meq | EXTENDED_RELEASE_TABLET | Freq: Once | ORAL | Status: AC
  Administered 2021-09-04: 40 meq via ORAL
  Filled 2021-09-04: qty 2

## 2021-09-04 MED ORDER — ACETAMINOPHEN 650 MG RE SUPP: 650.0000 mg | Freq: Four times a day (QID) | RECTAL | Status: DC | PRN

## 2021-09-04 MED ORDER — ONDANSETRON HCL 4 MG/2ML IJ SOLN: 4.0000 mg | Freq: Four times a day (QID) | INTRAMUSCULAR | Status: DC | PRN

## 2021-09-04 NOTE — Assessment & Plan Note (Signed)
Stable. -Resume amlodipine

## 2021-09-04 NOTE — ED Notes (Signed)
RN made aware that blood is ready in lab. Marionna Gonia

## 2021-09-04 NOTE — H&P (Signed)
History and Physical    Cheyenne Brown NMM:[id number] DOB: 25-Oct-1960 DOA: 09/04/2021  PCP: Barbie Banner, MD   Patient coming from: Home  I have personally briefly reviewed patient's old medical records in [facility]  Chief Complaint: Low Hemoglobin  HPI: Cheyenne Brown is a 61 y.o. female with medical history significant for hypertension, peptic ulcer disease, anemia, tobacco abuse. Patient presented to the ED with reports of low hemoglobin.  Routine blood work was drawn at her PCPs office, and she had a 4 g drop.  Patient reports gradual fatigue over the past several weeks to months, that is worsening.  No difficulty breathing, no chest pain.  She reports maybe 1 episode of unusual or maybe black stool a few weeks ago.  No vomiting.  No abdominal pain.  No blood in stool. Reports very rare ibuprofen use, may be 400 mg once a month.  Remote history of peptic ulcer disease may be 20 to 30 years ago.  She is unaware of a family history of bowel cancer.  No change in weight.  ED Course: Stable vitals.  Hemoglobin 6.7.  Blood work was done 2 days ago with unremarkable CMP per Care Everywhere, so BMP was deferred. EDP talked to Dr. Levon Hedger, recommended admission, plan for EGD in a.m.  Review of Systems: As per HPI all other systems reviewed and negative.  Past Medical History:  Diagnosis Date   Anemia    presumed related to metromenorrhagia   Arthritis    Constipation    GERD (gastroesophageal reflux disease)    Head trauma 1996   depressed skull fracture and epidural hematoma following below with a blunt object; surgery and prolonged rehabilitation required   Headache    migraines   History of tobacco abuse     15 pack years; discontinued 2008   Hypertension    Menometrorrhagia    PUD (peptic ulcer disease)    per pt report, likely secondary to [facility] powders    Past Surgical History:  Procedure Laterality Date   BRAIN SURGERY     COLONOSCOPY  10/30/2010   RMR: Anal  papilla, otherwise normal rectum and colon (melanosis coli)   crainiotomy  1996   ESOPHAGOGASTRODUODENOSCOPY  10/30/2010   RMR: Normal tubular esophagus. Staus post of maloney dilation as described above. . Normal stomach, duodenum through the second portion   LUMBAR LAMINECTOMY/DECOMPRESSION MICRODISCECTOMY Left 12/09/2018   Procedure: Left Lumbar Four-Five Lumbar Five-Sacral One Microdiscectomy;  Surgeon: Maeola Harman, MD;  Location: [facility] OR;  Service: Neurosurgery;  Laterality: Left;  Left Lumbar Four-Five Lumbar Five-Sacral One Microdiscectomy   MALONEY DILATION  10/30/2010   Procedure: MALONEY DILATION;  Surgeon: Corbin Ade, MD;  [facility];  Service: Endoscopy;  Laterality: N/A;   NECK SURGERY  2008 & 2011   x2   TRANSFORAMINAL LUMBAR INTERBODY FUSION (TLIF) WITH PEDICLE SCREW FIXATION 2 LEVEL Left 01/12/2020   Procedure: Left Lumbar Four-Five Lumbar Five Sacral One Transforaminal lumbar interbody fusion;  Surgeon: Maeola Harman, MD;  Location: [facility] OR;  Service: Neurosurgery;  Laterality: Left;  posterior     reports that she quit smoking about 14 years ago. Her smoking use included cigarettes. She has a 12.50 pack-year smoking history. She has never used smokeless tobacco. She reports that she does not drink alcohol and does not use drugs.  No Known Allergies  Family History  Problem Relation Age of Onset   Coronary artery disease Mother  CABG   Ulcers Father    Coronary artery disease Brother        CABG-2011   Coronary artery disease Sister        PCI   Colon cancer Neg Hx     Prior to Admission medications   Medication Sig Start Date End Date Taking? Authorizing Provider  ALPRAZolam Prudy Feeler) 1 MG tablet Take 1 mg by mouth 3 (three) times daily as needed for anxiety.    Yes [provider]  amLODipine (NORVASC) 5 MG tablet Take 5 mg by mouth daily.   Yes [provider]  bisacodyl (DULCOLAX) 5 MG EC tablet Take 10 mg by mouth daily as  needed for moderate constipation.   Yes [provider]  cyclobenzaprine (FLEXERIL) 10 MG tablet Take 10 mg by mouth at bedtime. 12/17/20  Yes [provider]  docusate sodium (COLACE) 100 MG capsule Take 200 mg by mouth daily.   Yes [provider]  gabapentin (NEURONTIN) 300 MG capsule Take 300 mg by mouth 3 (three) times daily.   Yes [provider]  HYDROcodone-acetaminophen (NORCO) 10-325 MG tablet Take 1 tablet by mouth 2 (two) times daily as needed for moderate pain. 05/28/21  Yes [provider]  omeprazole (PRILOSEC) 20 MG capsule TAKE ONE CAPSULE BY MOUTH ONCE DAILY. Patient taking differently: Take 20 mg by mouth daily. 03/14/15  Yes Tiffany Kocher, PA-C    Physical Exam: Vitals:   09/04/21 1615 09/04/21 1722 09/04/21 1729 09/04/21 1730  BP:  113/79  117/80  Pulse: 78  73 74  Resp: 14  18 (!) 25  Temp:      TempSrc:      SpO2: 100%  98% 100%  Weight:      Height:        Constitutional: NAD, calm, comfortable Vitals:   09/04/21 1615 09/04/21 1722 09/04/21 1729 09/04/21 1730  BP:  113/79  117/80  Pulse: 78  73 74  Resp: 14  18 (!) 25  Temp:      TempSrc:      SpO2: 100%  98% 100%  Weight:      Height:       Eyes: PERRL, lids and conjunctivae normal ENMT: Mucous membranes are dry.   Neck: normal, supple, no masses, no thyromegaly Respiratory: clear to auscultation bilaterally, no wheezing, no crackles. Normal respiratory effort.  Cardiovascular: Regular rate and rhythm, no murmurs / rubs / gallops. No extremity edema.  Abdomen: no tenderness, no masses palpated. No hepatosplenomegaly. Bowel sounds positive.  Musculoskeletal: no clubbing / cyanosis. No joint deformity upper and lower extremities.  Skin: no rashes, lesions, ulcers. No induration Neurologic:  No apparent cranial involvement, moving extremities spontaneously.  Psychiatric: Normal judgment and insight. Alert and oriented x 3. Normal mood.   Labs on Admission:  I have personally reviewed following labs and imaging studies  CBC: Recent Labs  Lab 09/04/21 1437  WBC 6.7  NEUTROABS 3.5  HGB 7.8*  HCT 28.3*  MCV 72.8*  PLT 302    Radiological Exams on Admission: No results found.  EKG: None  Assessment/Plan Principal Problem:   Acute anemia Active Problems:   Essential hypertension `  Assessment and Plan: * Acute anemia Acute symptomatic anemia, hemoglobin down 4 points, 7.8 today from 11 about a year ago.  Low MCV, MCHC and increased RDW suggests iron deficiency anemia.  Reports maybe 1 episode of melena, no overt obvious signs of GI blood loss.  Reports remote history of  peptic ulcer disease.  Denies significant NSAID use.  Reports she is due for colonoscopy. -Transfuse 1 unit PRBC -GI to see in a.m. -IV Protonix 40 daily -N.p.o. midnight -Check anemia panel  Essential hypertension Stable. -Resume Norvasc   DVT prophylaxis: SCDs, with unidentified etiology of anemia Code Status: Full code Family Communication: Spouse at bedside Disposition Plan: ~ 2 days Consults called: GI Admission status: Obs, telemetry   Author: Onnie Boer, MD 09/04/2021 6:18 PM  For on call review [email or web address].

## 2021-09-04 NOTE — ED Notes (Signed)
Pt ambulated to and from bathroom with assistance from this nurse tech. Nurse notified.

## 2021-09-04 NOTE — ED Provider Notes (Signed)
[facility] EMERGENCY DEPARTMENT Provider Note   CSN: [id number] Arrival date & time: 09/04/21  1404     History  No chief complaint on file.   Cheyenne Brown is a 61 y.o. female.  HPI  This patient is a 61 year old female, no significant prior medical history presenting to the hospital today with a complaint of possibly being anemic.  The patient reports to me that she has not felt anything but may be a slight fatigue recently but no dyspnea on exertion, no swelling of the legs, no lightheadedness headaches chest pain shortness of breath nausea vomiting or diarrhea.  She reports no blood in the stools and no history of significant anemia or blood transfusions though she thinks she may have been told that she had an iron deficiency in the past.  She does not take any iron supplements, she does take a proton pump inhibitor every morning.  She had blood work done at her family doctor's office a couple of days ago which showed that her hemoglobin was 7-1/2 and she was advised to come to the emergency department.  Her last draw was about 11 last year  Home Medications Prior to Admission medications   Medication Sig Start Date End Date Taking? Authorizing Provider  ALPRAZolam Prudy Feeler) 1 MG tablet Take 1 mg by mouth 3 (three) times daily as needed for anxiety.    Yes [provider]  amLODipine (NORVASC) 5 MG tablet Take 5 mg by mouth daily.   Yes [provider]  bisacodyl (DULCOLAX) 5 MG EC tablet Take 10 mg by mouth daily as needed for moderate constipation.   Yes [provider]  cyclobenzaprine (FLEXERIL) 10 MG tablet Take 10 mg by mouth at bedtime. 12/17/20  Yes [provider]  docusate sodium (COLACE) 100 MG capsule Take 200 mg by mouth daily.   Yes [provider]  gabapentin (NEURONTIN) 300 MG capsule Take 300 mg by mouth 3 (three) times daily.   Yes [provider]  HYDROcodone-acetaminophen (NORCO) 10-325 MG tablet Take 1  tablet by mouth 2 (two) times daily as needed for moderate pain. 05/28/21  Yes [provider]  omeprazole (PRILOSEC) 20 MG capsule TAKE ONE CAPSULE BY MOUTH ONCE DAILY. Patient taking differently: Take 20 mg by mouth daily. 03/14/15  Yes Tiffany Kocher, PA-C      Allergies    Patient has no known allergies.    Review of Systems   Review of Systems  All other systems reviewed and are negative.   Physical Exam Updated Vital Signs BP 117/72   Pulse 77   Temp 97.6 F (36.4 C) (Oral)   Resp 16   Ht 1.664 m (5' 5.5")   Wt 54.4 kg   SpO2 100%   BMI 19.67 kg/m  Physical Exam Vitals and nursing note reviewed.  Constitutional:      General: She is not in acute distress.    Appearance: She is well-developed.  HENT:     Head: Normocephalic and atraumatic.     Mouth/Throat:     Pharynx: No oropharyngeal exudate.  Eyes:     General: No scleral icterus.       Right eye: No discharge.        Left eye: No discharge.     Conjunctiva/sclera: Conjunctivae normal.     Pupils: Pupils are equal, round, and reactive to light.  Neck:     Thyroid: No thyromegaly.     Vascular: No JVD.  Cardiovascular:  Rate and Rhythm: Normal rate and regular rhythm.     Heart sounds: Normal heart sounds. No murmur heard.    No friction rub. No gallop.  Pulmonary:     Effort: Pulmonary effort is normal. No respiratory distress.     Breath sounds: Normal breath sounds. No wheezing or rales.  Abdominal:     General: Bowel sounds are normal. There is no distension.     Palpations: Abdomen is soft. There is no mass.     Tenderness: There is no abdominal tenderness.  Musculoskeletal:        General: No tenderness. Normal range of motion.     Cervical back: Normal range of motion and neck supple.  Lymphadenopathy:     Cervical: No cervical adenopathy.  Skin:    General: Skin is warm and dry.     Findings: No erythema or rash.  Neurological:     Mental Status: She is alert.     Coordination:  Coordination normal.  Psychiatric:        Behavior: Behavior normal.     ED Results / Procedures / Treatments   Labs (all labs ordered are listed, but only abnormal results are displayed) Labs Reviewed  CBC WITH DIFFERENTIAL/PLATELET - Abnormal; Notable for the following components:      Result Value   Hemoglobin 7.8 (*)    HCT 28.3 (*)    MCV 72.8 (*)    MCH 20.1 (*)    MCHC 27.6 (*)    RDW 18.6 (*)    All other components within normal limits  POC OCCULT BLOOD, ED - Abnormal; Notable for the following components:   Fecal Occult Bld POSITIVE (*)    All other components within normal limits  IRON AND TIBC  FERRITIN  BASIC METABOLIC PANEL  VITAMIN B12  FOLATE  TYPE AND SCREEN    EKG None  Radiology No results found.  Procedures Procedures    Medications Ordered in ED Medications  pantoprazole (PROTONIX) injection 40 mg (40 mg Intravenous Given 09/04/21 1727)    ED Course/ Medical Decision Making/ A&P                           Medical Decision Making Amount and/or Complexity of Data Reviewed Labs: ordered.  Risk Prescription drug management. Decision regarding hospitalization.   This patient presents to the ED for concern of anemia differential diagnosis includes blood loss anemia, bone marrow deficiency, iron deficiency    Additional history obtained:  Additional history obtained from electronic medical record External records from outside source obtained and reviewed including labs and records from the office, last hemoglobin was in fact around 7-1/2   Lab Tests:  I Ordered, and personally interpreted labs.  The pertinent results include: Hemoglobin of 7.8, type and screen sent, metabolic panel reviewed from the office 2 days ago which was totally normal, her MCV is in the low 70s   Medicines ordered and prescription drug management:  I ordered medication including iron supplement for iron deficiency anemia, Protonix for likely upper GI  bleed Reevaluation of the patient after these medicines showed that the patient unchanged I have reviewed the patients home medicines and have made adjustments as needed   Problem List / ED Course:  Likely iron deficiency anemia, Hemoccult positive, consult with gastroenterology for further evaluation, likely needs upper endoscopy and proton pump inhibitor Discussed with Dr. Mariea Clonts who will arrange admission to the hospital Discussed with Dr. Reuel Boom  Levon Hedger who will see the patient in consultation for possible endoscopy  Social Determinants of Health:  None           Final Clinical Impression(s) / ED Diagnoses Final diagnoses:  Anemia, unspecified type  Gastrointestinal hemorrhage, unspecified gastrointestinal hemorrhage type     Eber Hong, MD 09/04/21 1810

## 2021-09-04 NOTE — Assessment & Plan Note (Addendum)
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 Scherrie November to go home after IV iron after lunch

## 2021-09-04 NOTE — ED Triage Notes (Signed)
Pt presents with abnormal labs Hgb, per labs taken Monday, Hgb drop from 7 to 4, history of anemia.

## 2021-09-05 DIAGNOSIS — D62 Acute posthemorrhagic anemia: Secondary | ICD-10-CM

## 2021-09-05 DIAGNOSIS — D508 Other iron deficiency anemias: Secondary | ICD-10-CM | POA: Diagnosis not present

## 2021-09-05 DIAGNOSIS — R195 Other fecal abnormalities: Secondary | ICD-10-CM | POA: Diagnosis not present

## 2021-09-05 DIAGNOSIS — F419 Anxiety disorder, unspecified: Secondary | ICD-10-CM

## 2021-09-05 DIAGNOSIS — K31811 Angiodysplasia of stomach and duodenum with bleeding: Secondary | ICD-10-CM | POA: Diagnosis not present

## 2021-09-05 DIAGNOSIS — F1120 Opioid dependence, uncomplicated: Secondary | ICD-10-CM

## 2021-09-05 DIAGNOSIS — D649 Anemia, unspecified: Secondary | ICD-10-CM | POA: Diagnosis not present

## 2021-09-05 DIAGNOSIS — K922 Gastrointestinal hemorrhage, unspecified: Secondary | ICD-10-CM | POA: Diagnosis not present

## 2021-09-05 DIAGNOSIS — R69 Illness, unspecified: Secondary | ICD-10-CM | POA: Diagnosis not present

## 2021-09-05 DIAGNOSIS — I10 Essential (primary) hypertension: Secondary | ICD-10-CM | POA: Diagnosis not present

## 2021-09-05 LAB — TYPE AND SCREEN
ABO/RH(D): O POS
Antibody Screen: NEGATIVE
Unit division: 0

## 2021-09-05 LAB — HIV ANTIBODY (ROUTINE TESTING W REFLEX): HIV Screen 4th Generation wRfx: NONREACTIVE

## 2021-09-05 LAB — BPAM RBC
Blood Product Expiration Date: 202307272359
ISSUE DATE / TIME: 202306281949
Unit Type and Rh: 5100

## 2021-09-05 LAB — CBC
HCT: 31.8 % — ABNORMAL LOW (ref 36.0–46.0)
Hemoglobin: 9.3 g/dL — ABNORMAL LOW (ref 12.0–15.0)
MCH: 21.8 pg — ABNORMAL LOW (ref 26.0–34.0)
MCHC: 29.2 g/dL — ABNORMAL LOW (ref 30.0–36.0)
MCV: 74.6 fL — ABNORMAL LOW (ref 80.0–100.0)
Platelets: 257 10*3/uL (ref 150–400)
RBC: 4.26 MIL/uL (ref 3.87–5.11)
RDW: 20 % — ABNORMAL HIGH (ref 11.5–15.5)
WBC: 4.8 10*3/uL (ref 4.0–10.5)
nRBC: 0 % (ref 0.0–0.2)

## 2021-09-05 MED ORDER — BISACODYL 5 MG PO TBEC
10.0000 mg | DELAYED_RELEASE_TABLET | Freq: Once | ORAL | Status: AC
Start: 2021-09-05 — End: 2021-09-05
  Administered 2021-09-05: 10 mg via ORAL
  Filled 2021-09-05: qty 2

## 2021-09-05 MED ORDER — LINACLOTIDE 145 MCG PO CAPS
290.0000 ug | ORAL_CAPSULE | Freq: Every day | ORAL | Status: AC
Start: 2021-09-05 — End: 2021-09-05
  Administered 2021-09-05: 290 ug via ORAL
  Filled 2021-09-05: qty 2

## 2021-09-05 MED ORDER — PEG 3350-KCL-NA BICARB-NACL 420 G PO SOLR
4000.0000 mL | Freq: Once | ORAL | Status: AC
  Administered 2021-09-05: 4000 mL via ORAL

## 2021-09-05 NOTE — Assessment & Plan Note (Addendum)
Iron saturation 4 Ferritin 4 Plan for IV iron transfusion after endoscopic work up is done --nulecit IV x 1 given 6/30 --d/c home with po ferrous sulfate bid

## 2021-09-05 NOTE — Assessment & Plan Note (Signed)
PDMP reviewed Norco 10/325, #60--refilled monthly Xanax 1 mg, #120--refilled monthly

## 2021-09-05 NOTE — Assessment & Plan Note (Signed)
Continue home dose alprazolam PDMP reviewed as above

## 2021-09-05 NOTE — Consult Note (Signed)
Gastroenterology Consult   Referring Provider: No ref. provider found Primary Care Physician:  Barbie Banner, MD Primary Gastroenterologist:  Dr.Rourk  Patient ID: Cheyenne Brown; [id number]; 07/29/1960   Admit date: 09/04/2021  LOS: 0 days   Date of Consultation: 09/05/2021  Reason for Consultation:  melena, IDA    History of Present Illness   Cheyenne Brown is a 61 y.o. female presented due to anemia. She was found to have a low hemoglobin found as outpatient by her PCP on routine labs. Labs from 6/26, Hgb 7.8, Hct 27, MCV 67.7. Significant change from last Hgb of 11.0, MCV 90.3 in 01/2020.   In ED, Hgb 7.8, MCV 72.8, BUN 10, heme + stool. Iron 18, TIBC 463, iron sat 4%, ferritin 4. B12/folate normal.  Today her Hgb is 9.3 after one unit of prbcs.  Patient reports single episode of dark stool several weeks ago.  Not really melena.  Chronically constipated.  Previously did well on Linzess but coverage became too much so she has been utilizing regular over-the-counter laxatives.  Takes laxatives almost daily.  Takes hydrocodone for back pain but tries to take only once every couple of days due to constipation.  Last stool prior to admission was runny.  No recent blood in the stool noted.  Over the past couple months she has noted some mild gradual fatigue.  She thought it was just from her getting older.  No shortness of breath.  No lightheadedness or dizziness.  She currently is out of work pending disability trial.  She has chronic back pain with previous back surgeries as well.  Takes ibuprofen rarely, typically only 1 or 2 times per month.  Denies any aspirin use.  Takes omeprazole daily.  No heartburn, dysphagia, vomiting.  Weight has been stable. Remote history of possible PUD, she believes was related to aspirin powder use.  No family history of colon cancer.  Father had history of extensive ulcer disease in the setting of aspirin powder use requiring surgery.  Colonoscopy  10/2010: -normal  EGD 10/2010: -normal, s/p esophageal dilation for history of dysphagia -suspected globus due to manipulation in the setting of cervical spine surgery   Prior to Admission medications   Medication Sig Start Date End Date Taking? Authorizing Provider  ALPRAZolam Prudy Feeler) 1 MG tablet Take 1 mg by mouth 3 (three) times daily as needed for anxiety.    Yes [provider]  amLODipine (NORVASC) 5 MG tablet Take 5 mg by mouth daily.   Yes [provider]  bisacodyl (DULCOLAX) 5 MG EC tablet Take 10 mg by mouth daily as needed for moderate constipation.   Yes [provider]  cyclobenzaprine (FLEXERIL) 10 MG tablet Take 10 mg by mouth at bedtime. 12/17/20  Yes [provider]  docusate sodium (COLACE) 100 MG capsule Take 200 mg by mouth daily.   Yes [provider]  gabapentin (NEURONTIN) 300 MG capsule Take 300 mg by mouth 3 (three) times daily.   Yes [provider]  HYDROcodone-acetaminophen (NORCO) 10-325 MG tablet Take 1 tablet by mouth 2 (two) times daily as needed for moderate pain. 05/28/21  Yes [provider]  omeprazole (PRILOSEC) 20 MG capsule TAKE ONE CAPSULE BY MOUTH ONCE DAILY. Patient taking differently: Take 20 mg by mouth daily. 03/14/15  Yes Tiffany Kocher, PA-C    Current Facility-Administered Medications  Medication Dose Route Frequency Provider Last Rate Last Admin   acetaminophen (TYLENOL) tablet 650 mg  650 mg  Oral Q6H PRN Emokpae, Ejiroghene E, MD       Or   acetaminophen (TYLENOL) suppository 650 mg  650 mg Rectal Q6H PRN Emokpae, Ejiroghene E, MD       ALPRAZolam (XANAX) tablet 1 mg  1 mg Oral TID PRN Emokpae, Ejiroghene E, MD   1 mg at 09/04/21 2146   amLODipine (NORVASC) tablet 5 mg  5 mg Oral Daily Emokpae, Ejiroghene E, MD       cyclobenzaprine (FLEXERIL) tablet 10 mg  10 mg Oral QHS Emokpae, Ejiroghene E, MD   10 mg at 09/04/21 2146   gabapentin (NEURONTIN) capsule 300 mg  300 mg Oral TID  Emokpae, Ejiroghene E, MD   300 mg at 09/04/21 2146   ondansetron (ZOFRAN) tablet 4 mg  4 mg Oral Q6H PRN Emokpae, Ejiroghene E, MD       Or   ondansetron (ZOFRAN) injection 4 mg  4 mg Intravenous Q6H PRN Emokpae, Ejiroghene E, MD       pantoprazole (PROTONIX) injection 40 mg  40 mg Intravenous Q24H Emokpae, Ejiroghene E, MD       polyethylene glycol (MIRALAX / GLYCOLAX) packet 17 g  17 g Oral Daily PRN Emokpae, Ejiroghene E, MD        Allergies as of 09/04/2021   (No Known Allergies)    Past Medical History:  Diagnosis Date   Anemia    presumed related to metromenorrhagia   Arthritis    Constipation    GERD (gastroesophageal reflux disease)    Head trauma 1996   depressed skull fracture and epidural hematoma following below with a blunt object; surgery and prolonged rehabilitation required   Headache    migraines   History of tobacco abuse     15 pack years; discontinued 2008   Hypertension    Menometrorrhagia    PUD (peptic ulcer disease)    per pt report, likely secondary to [facility] powders    Past Surgical History:  Procedure Laterality Date   BRAIN SURGERY     COLONOSCOPY  10/30/2010   RMR: Anal papilla, otherwise normal rectum and colon (melanosis coli)   crainiotomy  1996   ESOPHAGOGASTRODUODENOSCOPY  10/30/2010   RMR: Normal tubular esophagus. Staus post of maloney dilation as described above. . Normal stomach, duodenum through the second portion   LUMBAR LAMINECTOMY/DECOMPRESSION MICRODISCECTOMY Left 12/09/2018   Procedure: Left Lumbar Four-Five Lumbar Five-Sacral One Microdiscectomy;  Surgeon: Maeola Harman, MD;  Location: [facility] OR;  Service: Neurosurgery;  Laterality: Left;  Left Lumbar Four-Five Lumbar Five-Sacral One Microdiscectomy   MALONEY DILATION  10/30/2010   Procedure: MALONEY DILATION;  Surgeon: Corbin Ade, MD;  Location: [facility] SUITE;  Service: Endoscopy;  Laterality: N/A;   NECK SURGERY  2008 & 2011   x2   TRANSFORAMINAL LUMBAR INTERBODY FUSION (TLIF) WITH  PEDICLE SCREW FIXATION 2 LEVEL Left 01/12/2020   Procedure: Left Lumbar Four-Five Lumbar Five Sacral One Transforaminal lumbar interbody fusion;  Surgeon: Maeola Harman, MD;  Location: [facility] OR;  Service: Neurosurgery;  Laterality: Left;  posterior    Family History  Problem Relation Age of Onset   Coronary artery disease Mother        CABG   Ulcers Father    Coronary artery disease Brother        CABG-2011   Coronary artery disease Sister        PCI   Colon cancer Neg Hx     Social History   Socioeconomic History   Marital  status: Married    Spouse name: Not on file   Number of children: 2   Years of education: Not on file   Highest education level: Not on file  Occupational History   Occupation: Airline pilot: [employer]    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  Tobacco Use   Smoking status: Former    Packs/day: 0.50    Years: 25.00    Total pack years: 12.50    Types: Cigarettes    Quit date: 05/29/2007    Years since quitting: 14.2   Smokeless tobacco: Never  Vaping Use   Vaping Use: Never used  Substance and Sexual Activity   Alcohol use: No   Drug use: No   Sexual activity: Not on file  Other Topics Concern   Not on file  Social History Narrative   Not on file   Social Determinants of Health   Financial Resource Strain: Not on file  Food Insecurity: Not on file  Transportation Needs: Not on file  Physical Activity: Not on file  Stress: Not on file  Social Connections: Not on file  Intimate Partner Violence: Not on file     Review of System:   General: Negative for anorexia, weight loss, fever, chills, weakness. See HPI. Eyes: Negative for vision changes.  ENT: Negative for hoarseness, difficulty swallowing , nasal congestion. CV: Negative for chest pain, angina, palpitations, dyspnea on exertion, peripheral edema.  Respiratory: Negative for dyspnea at rest, dyspnea on exertion, cough, sputum, wheezing.  GI: See history of present  illness. GU:  Negative for dysuria, hematuria, urinary incontinence, urinary frequency, nocturnal urination.  MS: Negative for joint pain.  Chronic low back pain.  Derm: Negative for rash or itching.  Neuro: Negative for weakness, abnormal sensation, seizure, frequent headaches, memory loss, confusion.  Psych: Negative for anxiety, depression, suicidal ideation, hallucinations.  Endo: Negative for unusual weight change.  Heme: Negative for bruising or bleeding. Allergy: Negative for rash or hives.      Physical Examination:   Vital signs in last 24 hours: Temp:  [97.6 F (36.4 C)-98.6 F (37 C)] 98.6 F (37 C) (06/29 0426) Pulse Rate:  [71-84] 74 (06/29 0426) Resp:  [10-25] 18 (06/29 0426) BP: (108-151)/(69-95) 119/82 (06/29 0426) SpO2:  [94 %-100 %] 99 % (06/29 0426) Weight:  [54.4 kg-54.7 kg] 54.7 kg (06/28 2015) Last BM Date : 09/04/21  General: Well-nourished, well-developed in no acute distress.  Husband at bedside. Head: Normocephalic, atraumatic.   Eyes: Conjunctiva pink, no icterus. Mouth: Oropharyngeal mucosa moist and pink , no lesions erythema or exudate. Neck: Supple without thyromegaly, masses, or lymphadenopathy.  Lungs: Clear to auscultation bilaterally.  Heart: Regular rate and rhythm, no murmurs rubs or gallops.  Abdomen: Bowel sounds are normal, nontender, nondistended, no hepatosplenomegaly or masses, no abdominal bruits or hernia , no rebound or guarding.   Rectal: not performed Extremities: No lower extremity edema, clubbing, deformity.  Neuro: Alert and oriented x 4 , grossly normal neurologically.  Skin: Warm and dry, no rash or jaundice.   Psych: Alert and cooperative, normal mood and affect.        Intake/Output from previous day: 06/28 0701 - 06/29 0700 In: 415 [P.O.:100; Blood:315] Out: -  Intake/Output this shift: No intake/output data recorded.  Lab Results:   CBC Recent Labs    09/04/21 1437 09/05/21 0542  WBC 6.7 4.8  HGB 7.8* 9.3*   HCT 28.3* 31.8*  MCV 72.8* 74.6*  PLT  302 257   BMET Recent Labs    09/04/21 1737  NA 142  K 3.4*  CL 110  CO2 25  GLUCOSE 95  BUN 10  CREATININE 0.79  CALCIUM 8.5*   LFT No results for input(s): "BILITOT", "BILIDIR", "IBILI", "ALKPHOS", "AST", "ALT", "PROT", "ALBUMIN" in the last 72 hours.  Lipase No results for input(s): "LIPASE" in the last 72 hours.  PT/INR No results for input(s): "LABPROT", "INR" in the last 72 hours.   Imaging Studies:   No results found.Pierre.Alas week]  Assessment:   Pleasant 61 y/o female presenting due to new anemia discovered on routine outpatient labs on 09/02/21. Hemoglobin of 7.8, low MCV of 67.7.  Patient sent to the ED for further management.  GI consulted for melena and iron deficiency anemia  IDA: labs consistent with profound IDA.  There was mention of possible melena a few weeks ago, patient states her stools were dark but not black.  She contributed this to her chronic constipation issues.  Denies any fresh blood per rectum recently although she has had episode of bright red blood per rectum with passage of hard stool.  Last EGD/colonoscopy in 2012, unremarkable. No significant NSAID/ASA use.  Differential diagnosis includes chronic occult GI bleeding from AVMs, ulcers, malignancy.  Needs to be screened for celiac disease although less likely.   Plan:   TTG IgA, serum IgA. Plan for colonoscopy with EGD tomorrow.  I have discussed the risks, alternatives, benefits with regards to but not limited to the risk of reaction to medication, bleeding, infection, perforation and the patient is agreeable to proceed. Written consent to be obtained. She will outpatient regimen for chronic constipation, previously did well on Linzess but issues with cost, "lost coverage for discount card".  We can assist her as an outpatient.  In the interim she could use MiraLAX 17 g 1-2 times daily.   LOS: 0 days   We would like to thank you for the opportunity to  participate in the care of Cheyenne Brown.  Leanna Battles. Dixon [facility] [phone] 6/29/20237:19 AM

## 2021-09-05 NOTE — Hospital Course (Addendum)
61 year old female with a history of hypertension, tobacco abuse in remission, anxiety, peptic ulcer disease presenting with low hemoglobin.  The patient had routine blood work performed that her PCPs office on 09/02/2021.  She was contacted that her hemoglobin was 7.8 which was at least 3 g drop from her prior values.  The patient had complained of some malaise and fatigue, but she denied any chest pain, shortness breath, dizziness, syncope.  She uses ibuprofen rarely, one 400 mg tablet monthly.  She denies any other NSAIDs.  She has had 2 episodes of melena in the past month but denies any hematochezia, hematemesis, hematuria.  She has not any fevers, chills, headache, nausea, vomit, diarrhea, abdominal pain. In the ED, the patient was afebrile hemodynamically stable with oxygen saturation 99% room air.  WBC 6.7, hemoglobin 7.8, platelets 202,000.  Sodium 142, potassium 3.4, bicarbonate 25, BUN 10, creatinine 0.79.  GI was consulted to assist with management.  10/30/2010 colonoscopy--anal papilla, otherwise normal 10/30/2010 EGD--normal esophagus, stomach, duodenum; maloney dilatation

## 2021-09-05 NOTE — H&P (View-Only) (Signed)
Gastroenterology Consult   Referring Provider: No ref. provider found Primary Care Physician:  Barbie Banner, MD Primary Gastroenterologist:  Dr.Rourk  Patient ID: Cheyenne Brown; [id number]; 07/29/1960   Admit date: 09/04/2021  LOS: 0 days   Date of Consultation: 09/05/2021  Reason for Consultation:  melena, IDA    History of Present Illness   Cheyenne Brown is a 61 y.o. female presented due to anemia. She was found to have a low hemoglobin found as outpatient by her PCP on routine labs. Labs from 6/26, Hgb 7.8, Hct 27, MCV 67.7. Significant change from last Hgb of 11.0, MCV 90.3 in 01/2020.   In ED, Hgb 7.8, MCV 72.8, BUN 10, heme + stool. Iron 18, TIBC 463, iron sat 4%, ferritin 4. B12/folate normal.  Today her Hgb is 9.3 after one unit of prbcs.  Patient reports single episode of dark stool several weeks ago.  Not really melena.  Chronically constipated.  Previously did well on Linzess but coverage became too much so she has been utilizing regular over-the-counter laxatives.  Takes laxatives almost daily.  Takes hydrocodone for back pain but tries to take only once every couple of days due to constipation.  Last stool prior to admission was runny.  No recent blood in the stool noted.  Over the past couple months she has noted some mild gradual fatigue.  She thought it was just from her getting older.  No shortness of breath.  No lightheadedness or dizziness.  She currently is out of work pending disability trial.  She has chronic back pain with previous back surgeries as well.  Takes ibuprofen rarely, typically only 1 or 2 times per month.  Denies any aspirin use.  Takes omeprazole daily.  No heartburn, dysphagia, vomiting.  Weight has been stable. Remote history of possible PUD, she believes was related to aspirin powder use.  No family history of colon cancer.  Father had history of extensive ulcer disease in the setting of aspirin powder use requiring surgery.  Colonoscopy  10/2010: -normal  EGD 10/2010: -normal, s/p esophageal dilation for history of dysphagia -suspected globus due to manipulation in the setting of cervical spine surgery   Prior to Admission medications   Medication Sig Start Date End Date Taking? Authorizing Provider  ALPRAZolam Prudy Feeler) 1 MG tablet Take 1 mg by mouth 3 (three) times daily as needed for anxiety.    Yes [provider]  amLODipine (NORVASC) 5 MG tablet Take 5 mg by mouth daily.   Yes [provider]  bisacodyl (DULCOLAX) 5 MG EC tablet Take 10 mg by mouth daily as needed for moderate constipation.   Yes [provider]  cyclobenzaprine (FLEXERIL) 10 MG tablet Take 10 mg by mouth at bedtime. 12/17/20  Yes [provider]  docusate sodium (COLACE) 100 MG capsule Take 200 mg by mouth daily.   Yes [provider]  gabapentin (NEURONTIN) 300 MG capsule Take 300 mg by mouth 3 (three) times daily.   Yes [provider]  HYDROcodone-acetaminophen (NORCO) 10-325 MG tablet Take 1 tablet by mouth 2 (two) times daily as needed for moderate pain. 05/28/21  Yes [provider]  omeprazole (PRILOSEC) 20 MG capsule TAKE ONE CAPSULE BY MOUTH ONCE DAILY. Patient taking differently: Take 20 mg by mouth daily. 03/14/15  Yes Tiffany Kocher, PA-C    Current Facility-Administered Medications  Medication Dose Route Frequency Provider Last Rate Last Admin   acetaminophen (TYLENOL) tablet 650 mg  650 mg  Oral Q6H PRN Emokpae, Ejiroghene E, MD       Or   acetaminophen (TYLENOL) suppository 650 mg  650 mg Rectal Q6H PRN Emokpae, Ejiroghene E, MD       ALPRAZolam (XANAX) tablet 1 mg  1 mg Oral TID PRN Emokpae, Ejiroghene E, MD   1 mg at 09/04/21 2146   amLODipine (NORVASC) tablet 5 mg  5 mg Oral Daily Emokpae, Ejiroghene E, MD       cyclobenzaprine (FLEXERIL) tablet 10 mg  10 mg Oral QHS Emokpae, Ejiroghene E, MD   10 mg at 09/04/21 2146   gabapentin (NEURONTIN) capsule 300 mg  300 mg Oral TID  Emokpae, Ejiroghene E, MD   300 mg at 09/04/21 2146   ondansetron (ZOFRAN) tablet 4 mg  4 mg Oral Q6H PRN Emokpae, Ejiroghene E, MD       Or   ondansetron (ZOFRAN) injection 4 mg  4 mg Intravenous Q6H PRN Emokpae, Ejiroghene E, MD       pantoprazole (PROTONIX) injection 40 mg  40 mg Intravenous Q24H Emokpae, Ejiroghene E, MD       polyethylene glycol (MIRALAX / GLYCOLAX) packet 17 g  17 g Oral Daily PRN Emokpae, Ejiroghene E, MD        Allergies as of 09/04/2021   (No Known Allergies)    Past Medical History:  Diagnosis Date   Anemia    presumed related to metromenorrhagia   Arthritis    Constipation    GERD (gastroesophageal reflux disease)    Head trauma 1996   depressed skull fracture and epidural hematoma following below with a blunt object; surgery and prolonged rehabilitation required   Headache    migraines   History of tobacco abuse     15 pack years; discontinued 2008   Hypertension    Menometrorrhagia    PUD (peptic ulcer disease)    per pt report, likely secondary to [facility] powders    Past Surgical History:  Procedure Laterality Date   BRAIN SURGERY     COLONOSCOPY  10/30/2010   RMR: Anal papilla, otherwise normal rectum and colon (melanosis coli)   crainiotomy  1996   ESOPHAGOGASTRODUODENOSCOPY  10/30/2010   RMR: Normal tubular esophagus. Staus post of maloney dilation as described above. . Normal stomach, duodenum through the second portion   LUMBAR LAMINECTOMY/DECOMPRESSION MICRODISCECTOMY Left 12/09/2018   Procedure: Left Lumbar Four-Five Lumbar Five-Sacral One Microdiscectomy;  Surgeon: Maeola Harman, MD;  Location: [facility] OR;  Service: Neurosurgery;  Laterality: Left;  Left Lumbar Four-Five Lumbar Five-Sacral One Microdiscectomy   MALONEY DILATION  10/30/2010   Procedure: MALONEY DILATION;  Surgeon: Corbin Ade, MD;  Location: [facility];  Service: Endoscopy;  Laterality: N/A;   NECK SURGERY  2008 & 2011   x2   TRANSFORAMINAL LUMBAR INTERBODY FUSION (TLIF) WITH  PEDICLE SCREW FIXATION 2 LEVEL Left 01/12/2020   Procedure: Left Lumbar Four-Five Lumbar Five Sacral One Transforaminal lumbar interbody fusion;  Surgeon: Maeola Harman, MD;  Location: [facility] OR;  Service: Neurosurgery;  Laterality: Left;  posterior    Family History  Problem Relation Age of Onset   Coronary artery disease Mother        CABG   Ulcers Father    Coronary artery disease Brother        CABG-2011   Coronary artery disease Sister        PCI   Colon cancer Neg Hx     Social History   Socioeconomic History   Marital  status: Married    Spouse name: Not on file   Number of children: 2   Years of education: Not on file   Highest education level: Not on file  Occupational History   Occupation: Airline pilot: [employer]    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  Tobacco Use   Smoking status: Former    Packs/day: 0.50    Years: 25.00    Total pack years: 12.50    Types: Cigarettes    Quit date: 05/29/2007    Years since quitting: 14.2   Smokeless tobacco: Never  Vaping Use   Vaping Use: Never used  Substance and Sexual Activity   Alcohol use: No   Drug use: No   Sexual activity: Not on file  Other Topics Concern   Not on file  Social History Narrative   Not on file   Social Determinants of Health   Financial Resource Strain: Not on file  Food Insecurity: Not on file  Transportation Needs: Not on file  Physical Activity: Not on file  Stress: Not on file  Social Connections: Not on file  Intimate Partner Violence: Not on file     Review of System:   General: Negative for anorexia, weight loss, fever, chills, weakness. See HPI. Eyes: Negative for vision changes.  ENT: Negative for hoarseness, difficulty swallowing , nasal congestion. CV: Negative for chest pain, angina, palpitations, dyspnea on exertion, peripheral edema.  Respiratory: Negative for dyspnea at rest, dyspnea on exertion, cough, sputum, wheezing.  GI: See history of present  illness. GU:  Negative for dysuria, hematuria, urinary incontinence, urinary frequency, nocturnal urination.  MS: Negative for joint pain.  Chronic low back pain.  Derm: Negative for rash or itching.  Neuro: Negative for weakness, abnormal sensation, seizure, frequent headaches, memory loss, confusion.  Psych: Negative for anxiety, depression, suicidal ideation, hallucinations.  Endo: Negative for unusual weight change.  Heme: Negative for bruising or bleeding. Allergy: Negative for rash or hives.      Physical Examination:   Vital signs in last 24 hours: Temp:  [97.6 F (36.4 C)-98.6 F (37 C)] 98.6 F (37 C) (06/29 0426) Pulse Rate:  [71-84] 74 (06/29 0426) Resp:  [10-25] 18 (06/29 0426) BP: (108-151)/(69-95) 119/82 (06/29 0426) SpO2:  [94 %-100 %] 99 % (06/29 0426) Weight:  [54.4 kg-54.7 kg] 54.7 kg (06/28 2015) Last BM Date : 09/04/21  General: Well-nourished, well-developed in no acute distress.  Husband at bedside. Head: Normocephalic, atraumatic.   Eyes: Conjunctiva pink, no icterus. Mouth: Oropharyngeal mucosa moist and pink , no lesions erythema or exudate. Neck: Supple without thyromegaly, masses, or lymphadenopathy.  Lungs: Clear to auscultation bilaterally.  Heart: Regular rate and rhythm, no murmurs rubs or gallops.  Abdomen: Bowel sounds are normal, nontender, nondistended, no hepatosplenomegaly or masses, no abdominal bruits or hernia , no rebound or guarding.   Rectal: not performed Extremities: No lower extremity edema, clubbing, deformity.  Neuro: Alert and oriented x 4 , grossly normal neurologically.  Skin: Warm and dry, no rash or jaundice.   Psych: Alert and cooperative, normal mood and affect.        Intake/Output from previous day: 06/28 0701 - 06/29 0700 In: 415 [P.O.:100; Blood:315] Out: -  Intake/Output this shift: No intake/output data recorded.  Lab Results:   CBC Recent Labs    09/04/21 1437 09/05/21 0542  WBC 6.7 4.8  HGB 7.8* 9.3*   HCT 28.3* 31.8*  MCV 72.8* 74.6*  PLT  302 257   BMET Recent Labs    09/04/21 1737  NA 142  K 3.4*  CL 110  CO2 25  GLUCOSE 95  BUN 10  CREATININE 0.79  CALCIUM 8.5*   LFT No results for input(s): "BILITOT", "BILIDIR", "IBILI", "ALKPHOS", "AST", "ALT", "PROT", "ALBUMIN" in the last 72 hours.  Lipase No results for input(s): "LIPASE" in the last 72 hours.  PT/INR No results for input(s): "LABPROT", "INR" in the last 72 hours.   Imaging Studies:   No results found.Pierre.Alas week]  Assessment:   Pleasant 61 y/o female presenting due to new anemia discovered on routine outpatient labs on 09/02/21. Hemoglobin of 7.8, low MCV of 67.7.  Patient sent to the ED for further management.  GI consulted for melena and iron deficiency anemia  IDA: labs consistent with profound IDA.  There was mention of possible melena a few weeks ago, patient states her stools were dark but not black.  She contributed this to her chronic constipation issues.  Denies any fresh blood per rectum recently although she has had episode of bright red blood per rectum with passage of hard stool.  Last EGD/colonoscopy in 2012, unremarkable. No significant NSAID/ASA use.  Differential diagnosis includes chronic occult GI bleeding from AVMs, ulcers, malignancy.  Needs to be screened for celiac disease although less likely.   Plan:   TTG IgA, serum IgA. Plan for colonoscopy with EGD tomorrow.  I have discussed the risks, alternatives, benefits with regards to but not limited to the risk of reaction to medication, bleeding, infection, perforation and the patient is agreeable to proceed. Written consent to be obtained. She will outpatient regimen for chronic constipation, previously did well on Linzess but issues with cost, "lost coverage for discount card".  We can assist her as an outpatient.  In the interim she could use MiraLAX 17 g 1-2 times daily.   LOS: 0 days   We would like to thank you for the opportunity to  participate in the care of Cheyenne Brown.  Leanna Battles. Dixon [facility] [phone] 6/29/20237:19 AM

## 2021-09-05 NOTE — Progress Notes (Signed)
  Transition of Care [facility]) Screening Note   Patient Details  Name: Cheyenne Brown Date of Birth: Sep 18, 1960   Transition of Care [facility]) CM/SW Contact:    Villa Herb, LCSWA Phone Number: 09/05/2021, 11:16 AM    Transition of Care Department [facility]) has reviewed patient and no TOC needs have been identified at this time. We will continue to monitor patient advancement through interdisciplinary progression rounds. If new patient transition needs arise, please place a TOC consult.

## 2021-09-05 NOTE — Progress Notes (Addendum)
PROGRESS NOTE  Cheyenne Brown WHQ:[id number] DOB: [birth year] DOA: 09/04/2021 PCP: Barbie Banner, MD  Brief History:  61 year old female with a history of hypertension, tobacco abuse in remission, anxiety, peptic ulcer disease presenting with low hemoglobin.  The patient had routine blood work performed that her PCPs office on 09/02/2021.  She was contacted that her hemoglobin was 7.8 which was at least 3 g drop from her prior values.  The patient had complained of some malaise and fatigue, but she denied any chest pain, shortness breath, dizziness, syncope.  She uses ibuprofen rarely, one 400 mg tablet monthly.  She denies any other NSAIDs.  She has had 2 episodes of melena in the past month but denies any hematochezia, hematemesis, hematuria.  She has not any fevers, chills, headache, nausea, vomit, diarrhea, abdominal pain. In the ED, the patient was afebrile hemodynamically stable with oxygen saturation 99% room air.  WBC 6.7, hemoglobin 7.8, platelets 202,000.  Sodium 142, potassium 3.4, bicarbonate 25, BUN 10, creatinine 0.79.  GI was consulted to assist with management.  10/30/2010 colonoscopy--anal papilla, otherwise normal 10/30/2010 EGD--normal esophagus, stomach, duodenum; maloney dilatation     Assessment and Plan: * Symptomatic anemia -01/10/2020 hemoglobin 11.1 -FOBT positive -Transfused 1 unit PRBC -GI consulted -IV Protonix 40 daily -N.p.o. midnight for EGD 6/29  Iron deficiency anemia Iron saturation 4 Ferritin 4 Plan for IV iron transfusion after endoscopic work up is done -will need to ultimate start po iron after d/c  Anxiety Continue home dose alprazolam PDMP reviewed as above  Opioid dependence (HCC) PDMP reviewed Norco 10/325, #60--refilled monthly Xanax 1 mg, #120--refilled monthly  Essential hypertension Stable. -Resume amlodipine    Family Communication:  no Family at bedside  Consultants:  GI  Code Status:  FULL  DVT Prophylaxis:   SCDs   Procedures: As Listed in Progress Note Above  Antibiotics: None       Subjective: Patient denies fevers, chills, headache, chest pain, dyspnea, nausea, vomiting, diarrhea, abdominal pain, dysuria, hematuria, hematochezia, and melena.   Objective: Vitals:   09/04/21 2024 09/04/21 2244 09/05/21 0219 09/05/21 0426  BP: 126/77 112/77 123/74 119/82  Pulse: 71 73 78 74  Resp: 20 20 18 18   Temp: 98.6 F (37 C) 97.8 F (36.6 C) 98.1 F (36.7 C) 98.6 F (37 C)  TempSrc: Oral Oral    SpO2: 100% 100% 100% 99%  Weight:      Height:        Intake/Output Summary (Last 24 hours) at 09/05/2021 09/07/2021 Last data filed at 09/04/2021 2244 Gross per 24 hour  Intake 415 ml  Output --  Net 415 ml   Weight change:  Exam:  General:  Pt is alert, follows commands appropriately, not in acute distress HEENT: No icterus, No thrush, No neck mass, [place]/AT Cardiovascular: RRR, S1/S2, no rubs, no gallops Respiratory: CTA bilaterally, no wheezing, no crackles, no rhonchi Abdomen: Soft/+BS, non tender, non distended, no guarding Extremities: No edema, No lymphangitis, No petechiae, No rashes, no synovitis   Data Reviewed: I have personally reviewed following labs and imaging studies Basic Metabolic Panel: Recent Labs  Lab 09/04/21 1737  NA 142  K 3.4*  CL 110  CO2 25  GLUCOSE 95  BUN 10  CREATININE 0.79  CALCIUM 8.5*   Liver Function Tests: No results for input(s): "AST", "ALT", "ALKPHOS", "BILITOT", "PROT", "ALBUMIN" in the last 168 hours. No results for input(s): "LIPASE", "AMYLASE" in the last 168  hours. No results for input(s): "AMMONIA" in the last 168 hours. Coagulation Profile: No results for input(s): "INR", "PROTIME" in the last 168 hours. CBC: Recent Labs  Lab 09/04/21 1437 09/05/21 0542  WBC 6.7 4.8  NEUTROABS 3.5  --   HGB 7.8* 9.3*  HCT 28.3* 31.8*  MCV 72.8* 74.6*  PLT 302 257   Cardiac Enzymes: No results for input(s): "CKTOTAL", "CKMB",  "CKMBINDEX", "TROPONINI" in the last 168 hours. BNP: Invalid input(s): "POCBNP" CBG: No results for input(s): "GLUCAP" in the last 168 hours. HbA1C: No results for input(s): "HGBA1C" in the last 72 hours. Urine analysis: No results found for: "COLORURINE", "APPEARANCEUR", "LABSPEC", "PHURINE", "GLUCOSEU", "HGBUR", "BILIRUBINUR", "KETONESUR", "PROTEINUR", "UROBILINOGEN", "NITRITE", "LEUKOCYTESUR" Sepsis Labs: @LABRCNTIP (procalcitonin:4,lacticidven:4) )No results found for this or any previous visit (from the past 240 hour(s)).   Scheduled Meds:  amLODipine  5 mg Oral Daily   cyclobenzaprine  10 mg Oral QHS   gabapentin  300 mg Oral TID   pantoprazole (PROTONIX) IV  40 mg Intravenous Q24H   Continuous Infusions:  Procedures/Studies: No results found.  , DO  [facility]  If 7PM-7AM, please contact night-coverage [email or web address] Password [facility] 09/05/2021, 6:43 AM   LOS: 0 days

## 2021-09-06 ENCOUNTER — Observation Stay (HOSPITAL_COMMUNITY): Payer: 59 | Admitting: Anesthesiology

## 2021-09-06 ENCOUNTER — Telehealth: Payer: Self-pay | Admitting: Gastroenterology

## 2021-09-06 ENCOUNTER — Encounter: Payer: Self-pay | Admitting: Internal Medicine

## 2021-09-06 ENCOUNTER — Encounter (HOSPITAL_COMMUNITY)
Admission: EM | Disposition: A | Discharge status: Home / Self Care | Payer: Self-pay | Source: Home / Self Care | Attending: Emergency Medicine

## 2021-09-06 ENCOUNTER — Observation Stay (HOSPITAL_BASED_OUTPATIENT_CLINIC_OR_DEPARTMENT_OTHER): Payer: 59 | Admitting: Anesthesiology

## 2021-09-06 ENCOUNTER — Encounter (HOSPITAL_COMMUNITY): Payer: Self-pay | Admitting: Internal Medicine

## 2021-09-06 DIAGNOSIS — R69 Illness, unspecified: Secondary | ICD-10-CM | POA: Diagnosis not present

## 2021-09-06 DIAGNOSIS — K648 Other hemorrhoids: Secondary | ICD-10-CM | POA: Diagnosis not present

## 2021-09-06 DIAGNOSIS — Z87891 Personal history of nicotine dependence: Secondary | ICD-10-CM

## 2021-09-06 DIAGNOSIS — D62 Acute posthemorrhagic anemia: Secondary | ICD-10-CM | POA: Diagnosis not present

## 2021-09-06 DIAGNOSIS — I10 Essential (primary) hypertension: Secondary | ICD-10-CM

## 2021-09-06 DIAGNOSIS — K922 Gastrointestinal hemorrhage, unspecified: Secondary | ICD-10-CM

## 2021-09-06 DIAGNOSIS — R195 Other fecal abnormalities: Secondary | ICD-10-CM | POA: Diagnosis not present

## 2021-09-06 DIAGNOSIS — F1120 Opioid dependence, uncomplicated: Secondary | ICD-10-CM | POA: Diagnosis not present

## 2021-09-06 DIAGNOSIS — K31811 Angiodysplasia of stomach and duodenum with bleeding: Secondary | ICD-10-CM

## 2021-09-06 DIAGNOSIS — D649 Anemia, unspecified: Secondary | ICD-10-CM | POA: Diagnosis not present

## 2021-09-06 HISTORY — PX: ESOPHAGOGASTRODUODENOSCOPY (EGD) WITH PROPOFOL: SHX5813

## 2021-09-06 HISTORY — PX: COLONOSCOPY WITH PROPOFOL: SHX5780

## 2021-09-06 LAB — IGA: IgA: 173 mg/dL (ref 87–352)

## 2021-09-06 SURGERY — COLONOSCOPY WITH PROPOFOL
Anesthesia: General

## 2021-09-06 MED ORDER — LIDOCAINE HCL (CARDIAC) PF 100 MG/5ML IV SOSY
PREFILLED_SYRINGE | INTRAVENOUS | Status: DC | PRN
  Administered 2021-09-06: 50 mg via INTRAVENOUS

## 2021-09-06 MED ORDER — LACTATED RINGERS IV SOLN: INTRAVENOUS | Status: DC | PRN

## 2021-09-06 MED ORDER — PANTOPRAZOLE SODIUM 40 MG PO TBEC
40.0000 mg | DELAYED_RELEASE_TABLET | Freq: Two times a day (BID) | ORAL | Status: DC
  Administered 2021-09-06: 40 mg via ORAL
  Filled 2021-09-06: qty 1

## 2021-09-06 MED ORDER — PROPOFOL 500 MG/50ML IV EMUL
INTRAVENOUS | Status: DC | PRN
  Administered 2021-09-06: 150 ug/kg/min via INTRAVENOUS

## 2021-09-06 MED ORDER — SODIUM CHLORIDE 0.9 % IV SOLN
250.0000 mg | Freq: Once | INTRAVENOUS | Status: AC
  Administered 2021-09-06: 250 mg via INTRAVENOUS
  Filled 2021-09-06: qty 20

## 2021-09-06 MED ORDER — PROPOFOL 10 MG/ML IV BOLUS
INTRAVENOUS | Status: DC | PRN
  Administered 2021-09-06: 100 mg via INTRAVENOUS

## 2021-09-06 MED ORDER — STERILE WATER FOR IRRIGATION IR SOLN
Status: DC | PRN
  Administered 2021-09-06: 30 mL

## 2021-09-06 MED ORDER — FERROUS SULFATE 325 (65 FE) MG PO TABS: 325.0000 mg | ORAL_TABLET | Freq: Two times a day (BID) | ORAL | Status: DC

## 2021-09-06 MED ORDER — PANTOPRAZOLE SODIUM 40 MG PO TBEC: 40.0000 mg | DELAYED_RELEASE_TABLET | Freq: Two times a day (BID) | ORAL | 2 refills | Status: DC

## 2021-09-06 MED ORDER — SODIUM CHLORIDE 0.9 % IV SOLN: INTRAVENOUS | Status: DC

## 2021-09-06 MED ORDER — FERROUS SULFATE 325 (65 FE) MG PO TABS: 325.0000 mg | ORAL_TABLET | Freq: Two times a day (BID) | ORAL | 3 refills | Status: DC

## 2021-09-06 NOTE — Op Note (Signed)
[facility] Patient Name: Cheyenne Brown Procedure Date: 09/06/2021 9:53 AM MRN: [id number] Date of Birth: June 14, 1960 Attending MD: Elon Alas. Abbey Chatters DO CSN: [id number] Age: 61 Admit Type: Outpatient Procedure:                Colonoscopy Indications:              Heme positive stool, Acute post hemorrhagic anemia Providers:                Elon Alas. Stratton Vill[place]ock, DO, [place] Risa Grill,                            Technician, Everardo Pacific Referring MD:              Medicines:                See the Anesthesia note for documentation of the                            administered medications Complications:            No immediate complications. Estimated Blood Loss:     Estimated blood loss: none. Procedure:                Pre-Anesthesia Assessment:                           - The anesthesia plan was to use monitored                            anesthesia care (MAC).                           After obtaining informed consent, the colonoscope                            was passed under direct vision. Throughout the                            procedure, the patient's blood pressure, pulse, and                            oxygen saturations were monitored continuously. The                            [id number]-HQ190L ([id number]) scope was introduced through                            the anus and advanced to the the cecum, identified                            by appendiceal orifice and ileocecal valve. The                            colonoscopy was performed without difficulty. The                            patient tolerated the procedure  well. The quality                            of the bowel preparation was evaluated using the                            BBPS [facility] Bowel Preparation Scale) with scores                            of: Right Colon = 3, Transverse Colon = 3 and Left                            Colon = 3 (entire mucosa seen well with no residual                             staining, small fragments of stool or opaque                            liquid). The total BBPS score equals 9. Scope In: 10:17:03 AM Scope Out: 10:27:52 AM Scope Withdrawal Time: 0 hours 7 minutes 17 seconds  Total Procedure Duration: 0 hours 10 minutes 49 seconds  Findings:      Hemorrhoids were found on perianal exam.      Non-bleeding internal hemorrhoids were found during retroflexion.      The exam was otherwise without abnormality. Impression:               - Hemorrhoids found on perianal exam.                           - Non-bleeding internal hemorrhoids.                           - The examination was otherwise normal.                           - No specimens collected. Moderate Sedation:      Per Anesthesia Care Recommendation:           - Return patient to hospital ward for ongoing care.                           - Soft diet. Procedure Code(s):        --- Professional ---                           [phone], Colonoscopy, flexible; diagnostic, including                            collection of specimen(s) by brushing or washing,                            when performed (separate procedure) Diagnosis Code(s):        --- Professional ---                           Y78.2, Other hemorrhoids  R19.5, Other fecal abnormalities                           D62, Acute posthemorrhagic anemia CPT copyright 2019 American Medical Association. All rights reserved. The codes documented in this report are preliminary and upon coder review may  be revised to meet current compliance requirements. Elon Alas. Abbey Chatters, DO [place] Lavetta Geier, DO 09/06/2021 10:30:32 AM This report has been signed electronically. Number of Addenda: 0

## 2021-09-06 NOTE — Progress Notes (Signed)
Pt tolerating her lunch well. Denies nausa. Cheyenne Brown

## 2021-09-06 NOTE — Telephone Encounter (Signed)
Cheyenne Brown - Rourk patient, seen inpatient for anemia and small bowel AVMs, please arrange hospital follow up in 3-4 weeks.   Brooke Bonito, MSN, FNP-BC, AGACNP-BC [facility]

## 2021-09-06 NOTE — Transfer of Care (Addendum)
Immediate Anesthesia Transfer of Care Note  Patient: Cheyenne Brown  Procedure(s) Performed: COLONOSCOPY WITH PROPOFOL ESOPHAGOGASTRODUODENOSCOPY (EGD) WITH PROPOFOL  Patient Location: PACU  Anesthesia Type:General  Level of Consciousness: awake, alert  and oriented  Airway & Oxygen Therapy: Patient Spontanous Breathing and Patient connected to nasal cannula oxygen  Post-op Assessment: Report given to RN, Post -op Vital signs reviewed and stable and Patient moving all extremities X 4  Post vital signs: Reviewed and stable  Last Vitals:  Vitals Value Taken Time  BP 107/57 09/06/21 1031  Temp    Pulse    Resp 20 09/06/21 1032  SpO2    Vitals shown include unvalidated device data.  Last Pain:  Vitals:   09/06/21 0955  TempSrc:   PainSc: 6       Patients Stated Pain Goal: 7 (09/06/21 0955)  Complications: No notable events documented.

## 2021-09-06 NOTE — Op Note (Addendum)
[facility] Patient Name: Cheyenne Brown Procedure Date: 09/06/2021 9:51 AM MRN: [id number] Date of Birth: June 30, 1960 Attending MD: Elon Alas. Abbey Chatters DO CSN: [id number] Age: 61 Admit Type: Inpatient Procedure:                Upper GI endoscopy Indications:              Acute post hemorrhagic anemia, Heme positive stool Providers:                Elon Alas. Sharlot Sturkey, DO, [place] Risa Grill,                            Technician, Everardo Pacific Referring MD:              Medicines:                See the Anesthesia note for documentation of the                            administered medications Complications:            No immediate complications. Estimated Blood Loss:     Estimated blood loss was minimal. Procedure:                Pre-Anesthesia Assessment:                           - The anesthesia plan was to use monitored                            anesthesia care (MAC).                           After obtaining informed consent, the endoscope was                            passed under direct vision. Throughout the                            procedure, the patient's blood pressure, pulse, and                            oxygen saturations were monitored continuously. The                            [id number]-H190 ([id number]) scope was introduced through the                            mouth, and advanced to the second part of duodenum.                            The upper GI endoscopy was accomplished without                            difficulty. The patient tolerated the procedure  well. Scope In: 9:59:38 AM Scope Out: 10:12:44 AM Total Procedure Duration: 0 hours 13 minutes 6 seconds  Findings:      There is no endoscopic evidence of bleeding, areas of erosion,       esophagitis, ulcerations or varices in the entire esophagus.      One 4 mm angiodysplastic lesion with bleeding was found on the lesser       curvature of the stomach. Coagulation for  hemostasis using argon plasma       was successful.      A single 5 mm angiodysplastic lesion with bleeding was found in the       gastric body. Coagulation for hemostasis using argon plasma was       unsuccessful. For hemostasis, two hemostatic clips were successfully       placed (MR conditional). Bleeding had stopped at the end of the       procedure.      The duodenal bulb, first portion of the duodenum and second portion of       the duodenum were normal. 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 Treated with argon plasma coagulation                            (APC).                           - A single bleeding angiodysplastic lesion in the                            stomach. Treatment not successful. Treated with                            argon plasma coagulation (APC). Clips (MR                            conditional) were placed.                           - Normal duodenal bulb, first portion of the                            duodenum and second portion of the duodenum.                           - No specimens collected. Moderate Sedation:      Per Anesthesia Care Recommendation:           - Return patient to hospital ward for ongoing care.                           - Soft diet.                           - Use a proton pump inhibitor PO BID. Procedure Code(s):        ---  Professional ---                           [phone], Esophagogastroduodenoscopy, flexible,                            transoral; with control of bleeding, any method Diagnosis Code(s):        --- Professional ---                           S28.768, Angiodysplasia of stomach and duodenum                            with bleeding                           D62, Acute posthemorrhagic anemia                           R19.5, Other fecal abnormalities CPT copyright 2019 American Medical Association. All rights reserved. The codes documented in this report  are preliminary and upon coder review may  be revised to meet current compliance requirements. Elon Alas. Abbey Chatters, DO [place] Azari Janssens, DO 09/06/2021 10:16:16 AM This report has been signed electronically. Number of Addenda: 0

## 2021-09-06 NOTE — Anesthesia Preprocedure Evaluation (Signed)
Anesthesia Evaluation  Patient identified by MRN, date of birth, ID band Patient awake    Reviewed: Allergy & Precautions, H&P , NPO status , Patient's Chart, lab work & pertinent test results, reviewed documented beta blocker date and time   Airway Mallampati: II  TM Distance: >3 FB Neck ROM: full    Dental no notable dental hx.    Pulmonary neg pulmonary ROS, former smoker,    Pulmonary exam normal breath sounds clear to auscultation       Cardiovascular Exercise Tolerance: Good hypertension, negative cardio ROS   Rhythm:regular Rate:Normal     Neuro/Psych  Headaches, PSYCHIATRIC DISORDERS Anxiety    GI/Hepatic Neg liver ROS, PUD, GERD  Medicated,  Endo/Other  negative endocrine ROS  Renal/GU negative Renal ROS  negative genitourinary   Musculoskeletal   Abdominal   Peds  Hematology  (+) Blood dyscrasia, anemia ,   Anesthesia Other Findings   Reproductive/Obstetrics negative OB ROS                             Anesthesia Physical Anesthesia Plan  ASA: 3 and emergent  Anesthesia Plan: General   Post-op Pain Management:    Induction:   PONV Risk Score and Plan: Propofol infusion  Airway Management Planned:   Additional Equipment:   Intra-op Plan:   Post-operative Plan:   Informed Consent: I have reviewed the patients History and Physical, chart, labs and discussed the procedure including the risks, benefits and alternatives for the proposed anesthesia with the patient or authorized representative who has indicated his/her understanding and acceptance.     Dental Advisory Given  Plan Discussed with: CRNA  Anesthesia Plan Comments:         Anesthesia Quick Evaluation

## 2021-09-06 NOTE — Progress Notes (Signed)
Pt arrived from PACU. Report given by [place] RN. Pt did well. Vitals were reported as "stable". Pt to receive Iron infusion and possibly discharge today. Cheyenne Brown

## 2021-09-06 NOTE — Progress Notes (Signed)
Cheyenne Brown with OR at bedside to take patient for EGD. She reports patient does not need second enema since she is clear. Patient confirms that she has been clear.  Cheyenne Brown

## 2021-09-06 NOTE — Interval H&P Note (Signed)
History and Physical Interval Note:  09/06/2021 9:52 AM  Cheyenne Brown  has presented today for surgery, with the diagnosis of ida, heme + stool.  The various methods of treatment have been discussed with the patient and family. After consideration of risks, benefits and other options for treatment, the patient has consented to  Procedure(s): COLONOSCOPY WITH PROPOFOL (N/A) ESOPHAGOGASTRODUODENOSCOPY (EGD) WITH PROPOFOL (N/A) as a surgical intervention.  The patient's history has been reviewed, patient examined, no change in status, stable for surgery.  I have reviewed the patient's chart and labs.  Questions were answered to the patient's satisfaction.     Lanelle Bal

## 2021-09-06 NOTE — Discharge Summary (Signed)
Physician Discharge Summary   Patient: Cheyenne Brown MRN: [id number] DOB: Jul 17, 1960  Admit date:     09/04/2021  Discharge date: 09/06/21  Discharge Physician: Onalee Hua Ivania Teagarden   PCP: Barbie Banner, MD   Recommendations at discharge:   Please follow up with primary care provider within 1-2 weeks  Please repeat BMP and CBC in one week      Hospital Course: 61 year old female with a history of hypertension, tobacco abuse in remission, anxiety, peptic ulcer disease presenting with low hemoglobin.  The patient had routine blood work performed that her PCPs office on 09/02/2021.  She was contacted that her hemoglobin was 7.8 which was at least 3 g drop from her prior values.  The patient had complained of some malaise and fatigue, but she denied any chest pain, shortness breath, dizziness, syncope.  She uses ibuprofen rarely, one 400 mg tablet monthly.  She denies any other NSAIDs.  She has had 2 episodes of melena in the past month but denies any hematochezia, hematemesis, hematuria.  She has not any fevers, chills, headache, nausea, vomit, diarrhea, abdominal pain. In the ED, the patient was afebrile hemodynamically stable with oxygen saturation 99% room air.  WBC 6.7, hemoglobin 7.8, platelets 202,000.  Sodium 142, potassium 3.4, bicarbonate 25, BUN 10, creatinine 0.79.  GI was consulted to assist with management.  10/30/2010 colonoscopy--anal papilla, otherwise normal 10/30/2010 EGD--normal esophagus, stomach, duodenum; maloney dilatation  Assessment and Plan: * Symptomatic anemia -01/10/2020 hemoglobin 11.1 -FOBT positive -Transfused 1 unit PRBC -GI consulted -IV Protonix 40 daily>>d/c home with po protonix bid -6/30 EGD--2 AVMs in stomach, one treated with APC, seconded required hemoclip 6/30 colonoscopy--nonbleeding internal and external hemorrhoids 6/30 discussed with GI, Dr. Scherrie November to go home after IV iron after lunch  Iron deficiency anemia Iron saturation 4 Ferritin 4 Plan  for IV iron transfusion after endoscopic work up is done --nulecit IV x 1 given 6/30 --d/c home with po ferrous sulfate bid  Anxiety Continue home dose alprazolam PDMP reviewed as above  Opioid dependence (HCC) PDMP reviewed Norco 10/325, #60--refilled monthly Xanax 1 mg, #120--refilled monthly  Essential hypertension Stable. -Resume amlodipine         Consultants: GI Procedures performed: EGD, colonoscopy as above  Disposition: Home Diet recommendation:  Cardiac diet DISCHARGE MEDICATION: Allergies as of 09/06/2021   No Known Allergies      Medication List     STOP taking these medications    omeprazole 20 MG capsule Commonly known as: PRILOSEC       TAKE these medications    ALPRAZolam 1 MG tablet Commonly known as: XANAX Take 1 mg by mouth 3 (three) times daily as needed for anxiety.   amLODipine 5 MG tablet Commonly known as: NORVASC Take 5 mg by mouth daily.   bisacodyl 5 MG EC tablet Commonly known as: DULCOLAX Take 10 mg by mouth daily as needed for moderate constipation.   cyclobenzaprine 10 MG tablet Commonly known as: FLEXERIL Take 10 mg by mouth at bedtime.   docusate sodium 100 MG capsule Commonly known as: COLACE Take 200 mg by mouth daily.   ferrous sulfate 325 (65 FE) MG tablet Take 1 tablet (325 mg total) by mouth 2 (two) times daily with a meal. Start taking on: September 07, 2021   gabapentin 300 MG capsule Commonly known as: NEURONTIN Take 300 mg by mouth 3 (three) times daily.   HYDROcodone-acetaminophen 10-325 MG tablet Commonly known as: NORCO Take 1 tablet by mouth 2 (two)  times daily as needed for moderate pain.   pantoprazole 40 MG tablet Commonly known as: PROTONIX Take 1 tablet (40 mg total) by mouth 2 (two) times daily.        Discharge Exam: Filed Weights   09/04/21 1419 09/04/21 2015 09/06/21 0857  Weight: 54.4 kg 54.7 kg 54.7 kg   HEENT:  [place]/AT, No thrush, no icterus CV:  RRR, no rub, no S3, no  S4 Lung:  CTA, no wheeze, no rhonchi Abd:  soft/+BS, NT Ext:  No edema, no lymphangitis, no synovitis, no rash   Condition at discharge: stable  The results of significant diagnostics from this hospitalization (including imaging, microbiology, ancillary and laboratory) are listed below for reference.   Imaging Studies: No results found.  Microbiology: Results for orders placed or performed during the hospital encounter of 01/10/20  SARS CORONAVIRUS 2 (Reanne Nellums 6-24 HRS) Nasopharyngeal Nasopharyngeal Swab     Status: None   Collection Time: 01/10/20  2:22 PM   Specimen: Nasopharyngeal Swab  Result Value Ref Range Status   SARS Coronavirus 2 NEGATIVE NEGATIVE Final    Comment: (NOTE) SARS-CoV-2 target nucleic acids are NOT DETECTED.  The SARS-CoV-2 RNA is generally detectable in upper and lower respiratory specimens during the acute phase of infection. Negative results do not preclude SARS-CoV-2 infection, do not rule out co-infections with other pathogens, and should not be used as the sole basis for treatment or other patient management decisions. Negative results must be combined with clinical observations, patient history, and epidemiological information. The expected result is Negative.  Fact Sheet for Patients: HairSlick.no  Fact Sheet for Healthcare Providers: quierodirigir.com  This test is not yet approved or cleared by the [place] FDA and  has been authorized for detection and/or diagnosis of SARS-CoV-2 by FDA under an Emergency Use Authorization (EUA). This EUA will remain  in effect (meaning this test can be used) for the duration of the COVID-19 declaration under Se ction 564(b)(1) of the Act, 21 U.S.C. section 360bbb-3(b)(1), unless the authorization is terminated or revoked sooner.  Performed at [facility], [address], [place], [place] [address]     Labs: CBC: Recent Labs  Lab  09/04/21 1437 09/05/21 0542  WBC 6.7 4.8  NEUTROABS 3.5  --   HGB 7.8* 9.3*  HCT 28.3* 31.8*  MCV 72.8* 74.6*  PLT 302 257   Basic Metabolic Panel: Recent Labs  Lab 09/04/21 1737  NA 142  K 3.4*  CL 110  CO2 25  GLUCOSE 95  BUN 10  CREATININE 0.79  CALCIUM 8.5*   Liver Function Tests: No results for input(s): "AST", "ALT", "ALKPHOS", "BILITOT", "PROT", "ALBUMIN" in the last 168 hours. CBG: No results for input(s): "GLUCAP" in the last 168 hours.  Discharge time spent: greater than 30 minutes.  Signed: Catarina Hartshorn, MD [facility] 09/06/2021

## 2021-09-07 LAB — TISSUE TRANSGLUTAMINASE, IGA: Tissue Transglutaminase Ab, IgA: <2 U/mL (ref 0–3)

## 2021-09-07 NOTE — Anesthesia Postprocedure Evaluation (Signed)
Anesthesia Post Note  Patient: Cheyenne Brown  Procedure(s) Performed: COLONOSCOPY WITH PROPOFOL ESOPHAGOGASTRODUODENOSCOPY (EGD) WITH PROPOFOL  Patient location during evaluation: Phase II Anesthesia Type: General Level of consciousness: awake Pain management: pain level controlled Vital Signs Assessment: post-procedure vital signs reviewed and stable Respiratory status: spontaneous breathing and respiratory function stable Cardiovascular status: blood pressure returned to baseline and stable Postop Assessment: no headache and no apparent nausea or vomiting Anesthetic complications: no Comments: Late entry   No notable events documented.   Last Vitals:  Vitals:   09/06/21 1311 09/06/21 1405  BP: (!) 146/80 (!) 143/87  Pulse: 85 80  Resp: 18 18  Temp: 36.8 C 36.7 C  SpO2: 100% 100%    Last Pain:  Vitals:   09/06/21 1405  TempSrc: Oral  PainSc:                  Windell Norfolk

## 2021-09-12 ENCOUNTER — Encounter (HOSPITAL_COMMUNITY): Payer: Self-pay | Admitting: Internal Medicine

## 2021-10-08 NOTE — Progress Notes (Unsigned)
GI Office Note    Referring Provider: Barbie Banner, MD Primary Care Physician:  Barbie Banner, MD  Primary Gastroenterologist: Dr. Jena Gauss  Chief Complaint   No chief complaint on file.   History of Present Illness   Cheyenne Brown is a 61 y.o. female presenting today at the request of Barbie Banner, MD for ***  Patient seen in the hospital 09/05/2021 for consult of melena and IDA.  She presented to the ED at the request of her PCP given that she had low hemoglobin on outpatient labs.  Labs from 6/26 for hemoglobin 7.8, hematocrit 27, MCV 67.7 which was a significant change from her last hemoglobin of 11 in November 2021.  In the ED she had hemoglobin of 7.8, MCV 72.8, BUN 10, heme positive stool, iron 18, TIBC 463, iron sat 4%, ferritin 4, B12 and folate normal.  She received a unit of PRBCs with hemoglobin improvement to 9.3.  On presentation patient reported 1 episode of dark stool several weeks prior to presentation and did admit to chronic constipation reporting that she previously did well Linzess but the medication became too expensive and she was using over-the-counter laxatives on a daily basis.  She takes hydrocodone for back pain and tries to only take it once every couple of days due to constipation.  Patient did note gradual fatigue over the couple prior months but she denied any shortness of breath, lightheadedness, or dizziness.  She reported rare NSAID use with ibuprofen only once or twice a month.  She denied dysphagia, vomiting, unintentional weight loss.  She reportedly was taking omeprazole daily and reported possible remote history of peptic ulcer disease related to aspirin powder use.  She reported history of these in her father as well due to aspirin powder use.  TTG IgA and IgA normal.  She received iron infusion while inpatient just prior to discharge and she was advised to take oral ferrous sulfate twice daily.  Prior GI work-up: Colonoscopy August 2012  -normal EGD August 2012 -normal s/p dilation for dysphagia, suspected globulus due to manipulation in the setting of cervical spine surgery  EGD 09/06/2021 -single 4 mm bleeding angiodysplastic lesion in the stomach treated with APC, another 5 mm bleeding angiodysplastic lesion treated with APC but was unsuccessful, 2 hemostatic clips placed, normal duodenum  Colonoscopy 09/06/2021 -hemorrhoids on perianal exam, nonbleeding internal hemorrhoids, exam otherwise normal.  Today:    Current Outpatient Medications  Medication Sig Dispense Refill   ALPRAZolam (XANAX) 1 MG tablet Take 1 mg by mouth 3 (three) times daily as needed for anxiety.      amLODipine (NORVASC) 5 MG tablet Take 5 mg by mouth daily.     bisacodyl (DULCOLAX) 5 MG EC tablet Take 10 mg by mouth daily as needed for moderate constipation.     cyclobenzaprine (FLEXERIL) 10 MG tablet Take 10 mg by mouth at bedtime.     docusate sodium (COLACE) 100 MG capsule Take 200 mg by mouth daily.     ferrous sulfate 325 (65 FE) MG tablet Take 1 tablet (325 mg total) by mouth 2 (two) times daily with a meal.  3   gabapentin (NEURONTIN) 300 MG capsule Take 300 mg by mouth 3 (three) times daily.     HYDROcodone-acetaminophen (NORCO) 10-325 MG tablet Take 1 tablet by mouth 2 (two) times daily as needed for moderate pain.     pantoprazole (PROTONIX) 40 MG tablet Take 1 tablet (40 mg total) by mouth 2 (  two) times daily. 60 tablet 2   No current facility-administered medications for this visit.    Past Medical History:  Diagnosis Date   Anemia    presumed related to metromenorrhagia   Arthritis    Constipation    GERD (gastroesophageal reflux disease)    Head trauma 1996   depressed skull fracture and epidural hematoma following below with a blunt object; surgery and prolonged rehabilitation required   Headache    migraines   History of tobacco abuse     15 pack years; discontinued 2008   Hypertension    Menometrorrhagia    PUD (peptic  ulcer disease)    per pt report, likely secondary to [facility] powders    Past Surgical History:  Procedure Laterality Date   BRAIN SURGERY     COLONOSCOPY  10/30/2010   RMR: Anal papilla, otherwise normal rectum and colon (melanosis coli)   COLONOSCOPY WITH PROPOFOL N/A 09/06/2021   Procedure: COLONOSCOPY WITH PROPOFOL;  Surgeon: Lanelle Bal, DO;  Location: [facility];  Service: Endoscopy;  Laterality: N/A;   crainiotomy  1996   ESOPHAGOGASTRODUODENOSCOPY  10/30/2010   RMR: Normal tubular esophagus. Staus post of maloney dilation as described above. . Normal stomach, duodenum through the second portion   ESOPHAGOGASTRODUODENOSCOPY (EGD) WITH PROPOFOL N/A 09/06/2021   Procedure: ESOPHAGOGASTRODUODENOSCOPY (EGD) WITH PROPOFOL;  Surgeon: Lanelle Bal, DO;  Location: [facility];  Service: Endoscopy;  Laterality: N/A;   LUMBAR LAMINECTOMY/DECOMPRESSION MICRODISCECTOMY Left 12/09/2018   Procedure: Left Lumbar Four-Five Lumbar Five-Sacral One Microdiscectomy;  Surgeon: Maeola Harman, MD;  Location: [facility] OR;  Service: Neurosurgery;  Laterality: Left;  Left Lumbar Four-Five Lumbar Five-Sacral One Microdiscectomy   MALONEY DILATION  10/30/2010   Procedure: MALONEY DILATION;  Surgeon: Corbin Ade, MD;  Location: [facility];  Service: Endoscopy;  Laterality: N/A;   NECK SURGERY  2008 & 2011   x2   TRANSFORAMINAL LUMBAR INTERBODY FUSION (TLIF) WITH PEDICLE SCREW FIXATION 2 LEVEL Left 01/12/2020   Procedure: Left Lumbar Four-Five Lumbar Five Sacral One Transforaminal lumbar interbody fusion;  Surgeon: Maeola Harman, MD;  Location: [facility] OR;  Service: Neurosurgery;  Laterality: Left;  posterior    Family History  Problem Relation Age of Onset   Coronary artery disease Mother        CABG   Ulcers Father    Coronary artery disease Brother        CABG-2011   Coronary artery disease Sister        PCI   Colon cancer Neg Hx     Allergies as of 10/10/2021   (No Known Allergies)    Social  History   Socioeconomic History   Marital status: Married    Spouse name: Not on file   Number of children: 2   Years of education: Not on file   Highest education level: Not on file  Occupational History   Occupation: Airline pilot: [employer]    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  Tobacco Use   Smoking status: Former    Packs/day: 0.50    Years: 25.00    Total pack years: 12.50    Types: Cigarettes    Quit date: 05/29/2007    Years since quitting: 14.3   Smokeless tobacco: Never  Vaping Use   Vaping Use: Never used  Substance and Sexual Activity   Alcohol use: No   Drug use: No   Sexual activity: Not on file  Other  Topics Concern   Not on file  Social History Narrative   Not on file   Social Determinants of Health   Financial Resource Strain: Not on file  Food Insecurity: Not on file  Transportation Needs: Not on file  Physical Activity: Not on file  Stress: Not on file  Social Connections: Not on file  Intimate Partner Violence: Not on file     Review of Systems   Gen: Denies any fever, chills, fatigue, weight loss, lack of appetite.  CV: Denies chest pain, heart palpitations, peripheral edema, syncope.  Resp: Denies shortness of breath at rest or with exertion. Denies wheezing or cough.  GI: see HPI GU : Denies urinary burning, urinary frequency, urinary hesitancy MS: Denies joint pain, muscle weakness, cramps, or limitation of movement.  Derm: Denies rash, itching, dry skin Psych: Denies depression, anxiety, memory loss, and confusion Heme: Denies bruising, bleeding, and enlarged lymph nodes.   Physical Exam   There were no vitals taken for this visit.  General:   Alert and oriented. Pleasant and cooperative. Well-nourished and well-developed.  Head:  Normocephalic and atraumatic. Eyes:  Without icterus, sclera clear and conjunctiva pink.  Ears:  Normal auditory acuity. Mouth:  No deformity or lesions, oral mucosa pink.   Lungs:  Clear to auscultation bilaterally. No wheezes, rales, or rhonchi. No distress.  Heart:  S1, S2 present without murmurs appreciated.  Abdomen:  +BS, soft, non-tender and non-distended. No HSM noted. No guarding or rebound. No masses appreciated.  Rectal:  Deferred  Msk:  Symmetrical without gross deformities. Normal posture. Extremities:  Without edema. Neurologic:  Alert and  oriented x4;  grossly normal neurologically. Skin:  Intact without significant lesions or rashes. Psych:  Alert and cooperative. Normal mood and affect.   Assessment   Cheyenne Brown is a 61 y.o. female with a history of dysphagia, GERD, chronic constipation, anemia, HTN, tobacco abuse*** presenting today for hospital follow up of anemia.   Iron deficiency anemia: Patient reported remote history of peptic ulcer disease about 20 to 30 years ago secondary to aspirin powder use.  She currently has not been taking NSAIDs on a regular basis and denies any aspirin powder use.  She was recently hospitalized due to anemia and presented with hemoglobin of 7.8, MCV 67.7, iron 18, iron sat 4%, ferritin 4.  She received 1 unit PRBCs with improvement of her hemoglobin at 9.9 as well as iron infusion post EGD and colonoscopy.  Colonoscopy was completely normal except for nonbleeding internal hemorrhoids, however EGD revealed 2 bleeding angiodysplastic lesions in the stomach, one treated with APC and one of them treated with 2 hemostatic clips.    PLAN   ***    Brooke Bonito, MSN, FNP-BC, AGACNP-BC [facility]

## 2021-10-10 ENCOUNTER — Ambulatory Visit: Payer: 59 | Admitting: Gastroenterology

## 2021-10-10 ENCOUNTER — Encounter: Payer: Self-pay | Admitting: Gastroenterology

## 2021-10-10 VITALS — BP 162/97 | HR 92 | Temp 98.1°F | Ht 65.5 in | Wt 119.6 lb

## 2021-10-10 DIAGNOSIS — D508 Other iron deficiency anemias: Secondary | ICD-10-CM

## 2021-10-10 DIAGNOSIS — K5904 Chronic idiopathic constipation: Secondary | ICD-10-CM

## 2021-10-10 MED ORDER — LINACLOTIDE 145 MCG PO CAPS: 145.0000 ug | ORAL_CAPSULE | Freq: Every day | ORAL | 3 refills | Status: DC

## 2021-10-10 NOTE — Patient Instructions (Addendum)
I sent prescription for Linzess to [facility].  I am also providing you some samples today and a discount card.  I have ordered your labs and you may go down the street to [facility] to have them completed.  Continue to avoid NSAIDs and alcohol.  Please also stay away from any trigger foods such as spicy/greasy foods, coffee, caffeine, chocolate, citrus drinks.  These can cause worsening inflammation in your stomach.  I suspect your nausea may be related to your iron.  If you want to take in the mornings you should take it with a decent meal.  In order to combat your morning nausea I would prefer for you to take it with dinner to avoid nausea throughout the day.  You may continue to take your omeprazole 40 mg twice daily.  I will have you follow-up in 3 to 4 months, we can do this in person or virtually.  It was a pleasure to see you today. I want to create trusting relationships with patients. If you receive a survey regarding your visit,  I greatly appreciate you taking time to fill this out on paper or through your MyChart. I value your feedback.  Brooke Bonito, MSN, FNP-BC, AGACNP-BC [facility]

## 2021-10-11 LAB — IRON,TIBC AND FERRITIN PANEL
%SAT: 35 % (calc) (ref 16–45)
Ferritin: 35 ng/mL (ref 16–288)
Iron: 117 ug/dL (ref 45–160)
TIBC: 339 mcg/dL (calc) (ref 250–450)

## 2021-10-11 LAB — CBC
HCT: 39.2 % (ref 35.0–45.0)
Hemoglobin: 12.1 g/dL (ref 11.7–15.5)
MCH: 24.8 pg — ABNORMAL LOW (ref 27.0–33.0)
MCHC: 30.9 g/dL — ABNORMAL LOW (ref 32.0–36.0)
MCV: 80.5 fL (ref 80.0–100.0)
MPV: 11.1 fL (ref 7.5–12.5)
Platelets: 204 10*3/uL (ref 140–400)
RBC: 4.87 10*6/uL (ref 3.80–5.10)
WBC: 5.1 10*3/uL (ref 3.8–10.8)

## 2021-10-15 ENCOUNTER — Telehealth: Payer: Self-pay | Admitting: Internal Medicine

## 2021-10-15 ENCOUNTER — Telehealth: Payer: Self-pay | Admitting: *Deleted

## 2021-10-15 NOTE — Telephone Encounter (Signed)
Received denial letter for Linzess 145. Pt needs to try a formulary alternatives, which is Amitiza.

## 2021-10-15 NOTE — Telephone Encounter (Signed)
There was a man outside yelling at me after I had locked the front gate asking about patient's Linzess being denied by insurance and when were we going to send in an alternative to her pharmacy, He said he brought papers by yesterday. I told him that I would have someone call the patient in the morning.

## 2021-10-15 NOTE — Telephone Encounter (Signed)
LMOM for pt to call office  

## 2021-10-16 ENCOUNTER — Other Ambulatory Visit: Payer: Self-pay | Admitting: Gastroenterology

## 2021-10-16 DIAGNOSIS — K5904 Chronic idiopathic constipation: Secondary | ICD-10-CM

## 2021-10-16 MED ORDER — LUBIPROSTONE 24 MCG PO CAPS: 24.0000 ug | ORAL_CAPSULE | Freq: Two times a day (BID) | ORAL | 5 refills | Status: DC

## 2021-10-16 NOTE — Telephone Encounter (Signed)
Left detailed message on pt's voicemail that prescription was sent to [facility] and she should take Amitiza with food.

## 2021-10-16 NOTE — Telephone Encounter (Signed)
LMOM for pt to call office  

## 2021-10-16 NOTE — Telephone Encounter (Signed)
Thank you. Please send it in.

## 2021-10-21 ENCOUNTER — Telehealth: Payer: Self-pay | Admitting: *Deleted

## 2021-10-21 NOTE — Telephone Encounter (Signed)
Received call from Alric Ran with [facility]. She reports she sent over a records request on 8/11 requesting records from 08/28/18-present. She can be reached at [phone]

## 2021-10-23 NOTE — Telephone Encounter (Signed)
Done, records sent this morning

## 2021-10-30 ENCOUNTER — Other Ambulatory Visit: Payer: Self-pay | Admitting: *Deleted

## 2021-10-30 DIAGNOSIS — D509 Iron deficiency anemia, unspecified: Secondary | ICD-10-CM

## 2021-11-04 ENCOUNTER — Telehealth: Payer: Self-pay | Admitting: *Deleted

## 2021-11-04 NOTE — Telephone Encounter (Signed)
Received letter from [facility] approving Lubiprostone 24 mcg. Copy sent to scan center.

## 2021-12-12 ENCOUNTER — Ambulatory Visit: Payer: 59 | Admitting: Gastroenterology

## 2022-01-18 NOTE — Progress Notes (Deleted)
GI Office Note    Referring Provider: Barbie Banner, MD Primary Care Physician:  Barbie Banner, MD Primary Gastroenterologist: ***  Date:  01/18/2022  ID:  Cheyenne Brown, DOB Jul 24, 1960, MRN [id number]   Chief Complaint   No chief complaint on file.    History of Present Illness  Cheyenne Brown is a 61 y.o. female with a history of dysphagia, GERD, constipation, anemia, HTN, tobacco use*** presenting today for follow-up.  Hospitalization in June 2023 for melena and IDA.  Presented with hemoglobin of 7.8.  Received 1 unit of blood.  Low iron, saturation, and ferritin.  Reported possible remote history of peptic ulcer disease related to aspirin powder use.  Received iron infusion prior to discharge and advised to take oral iron twice daily. Underwent EGD and colonoscopy as stated below.  Prior GI work-up: Colonoscopy August 2012-normal EGD August 2012/normal s/p dilation for dysphagia, suspected globulus due to manipulation in the setting of cervical spine surgery  EGD 09/06/2021: -4 mm bleeding angiodysplastic lesion in the stomach treated with APC -5 mm bleeding angiodysplastic lesion treated with APC that was unsuccessful, hemostatic clips x2 -Normal duodenum  Colonoscopy 09/06/2021: -External hemorrhoids -Nonbleeding internal hemorrhoids -Repeat in 10 years   Last office visit 10/10/2021.  Taking iron daily.  Was taking omeprazole instead of prescribed pantoprazole from the hospital.  Still lack appetite, but reports she is picky and this is her baseline.  Continue to have nausea in the mornings.  Still able to keep food down.  Denied any vomiting.  Occasionally taking her iron in the stomach.  Denied melena, BRBPR, dysphagia, abdominal pain.  Noted ongoing constipation.  Will need to restart Linzess.  Had elevated BP during next visit.  CBC and iron panel ordered.-Continue omeprazole 40 mg twice daily.  Advised to continue oral iron but to take in the evenings or with  food.  Linzess prescription sent 10 samples provided.  Avoid NSAIDs.  Insurance denied Linzess and preferred for patient to try alternative such as Amitiza.  This was sent in and approved.  Labs 10/10/2021: Hemoglobin 12.1 and normal iron panel.   Today: Constipation -  Anemia -    Current Outpatient Medications  Medication Sig Dispense Refill   ALPRAZolam (XANAX) 1 MG tablet Take 1 mg by mouth 3 (three) times daily as needed for anxiety.      amLODipine (NORVASC) 5 MG tablet Take 5 mg by mouth daily.     bisacodyl (DULCOLAX) 5 MG EC tablet Take 10 mg by mouth daily as needed for moderate constipation.     cyclobenzaprine (FLEXERIL) 10 MG tablet Take 10 mg by mouth at bedtime.     ferrous sulfate 325 (65 FE) MG tablet Take 1 tablet (325 mg total) by mouth 2 (two) times daily with a meal.  3   gabapentin (NEURONTIN) 300 MG capsule Take 300 mg by mouth 3 (three) times daily.     HYDROcodone-acetaminophen (NORCO) 10-325 MG tablet Take 1 tablet by mouth 2 (two) times daily as needed for moderate pain.     lubiprostone (AMITIZA) 24 MCG capsule Take 1 capsule (24 mcg total) by mouth 2 (two) times daily with a meal. 60 capsule 5   omeprazole (PRILOSEC) 20 MG capsule Take 20 mg by mouth daily.     polyethylene glycol (MIRALAX / GLYCOLAX) 17 g packet Take 17 g by mouth 2 (two) times daily.     No current facility-administered medications for this visit.    Past Medical  History:  Diagnosis Date   Anemia    presumed related to metromenorrhagia   Arthritis    Constipation    GERD (gastroesophageal reflux disease)    Head trauma 1996   depressed skull fracture and epidural hematoma following below with a blunt object; surgery and prolonged rehabilitation required   Headache    migraines   History of tobacco abuse     15 pack years; discontinued 2008   Hypertension    Menometrorrhagia    PUD (peptic ulcer disease)    per pt report, likely secondary to [facility] powders    Past Surgical  History:  Procedure Laterality Date   BRAIN SURGERY     COLONOSCOPY  10/30/2010   RMR: Anal papilla, otherwise normal rectum and colon (melanosis coli)   COLONOSCOPY WITH PROPOFOL N/A 09/06/2021   Procedure: COLONOSCOPY WITH PROPOFOL;  Surgeon: Lanelle Bal, DO;  [facility];  Service: Endoscopy;  Laterality: N/A;   crainiotomy  1996   ESOPHAGOGASTRODUODENOSCOPY  10/30/2010   RMR: Normal tubular esophagus. Staus post of maloney dilation as described above. . Normal stomach, duodenum through the second portion   ESOPHAGOGASTRODUODENOSCOPY (EGD) WITH PROPOFOL N/A 09/06/2021   Procedure: ESOPHAGOGASTRODUODENOSCOPY (EGD) WITH PROPOFOL;  Surgeon: Lanelle Bal, DO;  [facility];  Service: Endoscopy;  Laterality: N/A;   LUMBAR LAMINECTOMY/DECOMPRESSION MICRODISCECTOMY Left 12/09/2018   Procedure: Left Lumbar Four-Five Lumbar Five-Sacral One Microdiscectomy;  Surgeon: Maeola Harman, MD;  Location: [facility] OR;  Service: Neurosurgery;  Laterality: Left;  Left Lumbar Four-Five Lumbar Five-Sacral One Microdiscectomy   MALONEY DILATION  10/30/2010   Procedure: MALONEY DILATION;  Surgeon: Corbin Ade, MD;  [facility];  Service: Endoscopy;  Laterality: N/A;   NECK SURGERY  2008 & 2011   x2   TRANSFORAMINAL LUMBAR INTERBODY FUSION (TLIF) WITH PEDICLE SCREW FIXATION 2 LEVEL Left 01/12/2020   Procedure: Left Lumbar Four-Five Lumbar Five Sacral One Transforaminal lumbar interbody fusion;  Surgeon: Maeola Harman, MD;  Location: [facility] OR;  Service: Neurosurgery;  Laterality: Left;  posterior    Family History  Problem Relation Age of Onset   Coronary artery disease Mother        CABG   Ulcers Father    Coronary artery disease Brother        CABG-2011   Coronary artery disease Sister        PCI   Colon cancer Neg Hx     Allergies as of 01/20/2022   (No Known Allergies)    Social History   Socioeconomic History   Marital status: Married    Spouse name: Not on  file   Number of children: 2   Years of education: Not on file   Highest education level: Not on file  Occupational History   Occupation: Airline pilot: [employer]    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  Tobacco Use   Smoking status: Former    Packs/day: 0.50    Years: 25.00    Total pack years: 12.50    Types: Cigarettes    Quit date: 05/29/2007    Years since quitting: 14.6   Smokeless tobacco: Never  Vaping Use   Vaping Use: Never used  Substance and Sexual Activity   Alcohol use: No   Drug use: No   Sexual activity: Yes  Other Topics Concern   Not on file  Social History Narrative   Not on file   Social Determinants of Health  Financial Resource Strain: Not on file  Food Insecurity: Not on file  Transportation Needs: Not on file  Physical Activity: Not on file  Stress: Not on file  Social Connections: Not on file     Review of Systems   Gen: Denies fever, chills, anorexia. Denies fatigue, weakness, weight loss.  CV: Denies chest pain, palpitations, syncope, peripheral edema, and claudication. Resp: Denies dyspnea at rest, cough, wheezing, coughing up blood, and pleurisy. GI: See HPI Derm: Denies rash, itching, dry skin Psych: Denies depression, anxiety, memory loss, confusion. No homicidal or suicidal ideation.  Heme: Denies bruising, bleeding, and enlarged lymph nodes.   Physical Exam   There were no vitals taken for this visit.  General:   Alert and oriented. No distress noted. Pleasant and cooperative.  Head:  Normocephalic and atraumatic. Eyes:  Conjuctiva clear without scleral icterus. Mouth:  Oral mucosa pink and moist. Good dentition. No lesions. Lungs:  Clear to auscultation bilaterally. No wheezes, rales, or rhonchi. No distress.  Heart:  S1, S2 present without murmurs appreciated.  Abdomen:  +BS, soft, non-tender and non-distended. No rebound or guarding. No HSM or masses noted. Rectal: *** Msk:  Symmetrical without gross  deformities. Normal posture. Extremities:  Without edema. Neurologic:  Alert and  oriented x4 Psych:  Alert and cooperative. Normal mood and affect.   Assessment  Cheyenne Brown is a 61 y.o. 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  Chronic Constipation:   Iron deficiency anemia:    PLAN   ***     Brooke Bonito, MSN, FNP-BC, AGACNP-BC [facility]

## 2022-01-20 ENCOUNTER — Ambulatory Visit: Payer: 59 | Admitting: Gastroenterology

## 2022-02-07 ENCOUNTER — Telehealth: Payer: Self-pay | Admitting: *Deleted

## 2022-02-07 ENCOUNTER — Other Ambulatory Visit: Payer: Self-pay | Admitting: *Deleted

## 2022-02-07 DIAGNOSIS — D509 Iron deficiency anemia, unspecified: Secondary | ICD-10-CM

## 2022-02-07 NOTE — Telephone Encounter (Signed)
Mailed lab requisitions to have completed for December.

## 2022-06-09 ENCOUNTER — Ambulatory Visit
Admission: EM | Admit: 2022-06-09 | Discharge: 2022-06-09 | Disposition: A | Discharge status: Home / Self Care | Payer: PPO | Attending: Nurse Practitioner | Admitting: Nurse Practitioner

## 2022-06-09 DIAGNOSIS — R300 Dysuria: Secondary | ICD-10-CM | POA: Diagnosis not present

## 2022-06-09 LAB — POCT URINALYSIS DIP (MANUAL ENTRY)
Bilirubin, UA: NEGATIVE
Blood, UA: NEGATIVE
Glucose, UA: NEGATIVE mg/dL
Ketones, POC UA: NEGATIVE mg/dL
Leukocytes, UA: NEGATIVE
Nitrite, UA: NEGATIVE
Protein Ur, POC: NEGATIVE mg/dL
Spec Grav, UA: <=1.005 — AB (ref 1.010–1.025)
Urobilinogen, UA: 0.2 E.U./dL
pH, UA: 5.5 (ref 5.0–8.0)

## 2022-06-09 MED ORDER — PHENAZOPYRIDINE HCL 100 MG PO TABS: 100.0000 mg | ORAL_TABLET | Freq: Three times a day (TID) | ORAL | 0 refills | Status: DC | PRN

## 2022-06-09 MED ORDER — CEPHALEXIN 500 MG PO CAPS: 500.0000 mg | ORAL_CAPSULE | Freq: Two times a day (BID) | ORAL | 0 refills | Status: AC

## 2022-06-09 NOTE — Discharge Instructions (Addendum)
It sounds like you have a UTI.  Please take the Keflex as prescribed to treat it.  Continue drinking plenty of water. You can take the pyridium as needed for bladder pain.    We will call you later this week if the urine culture shows we need to treat with a different antibiotic.

## 2022-06-09 NOTE — ED Triage Notes (Signed)
Pt states she is having trouble urinating, feels like she has to try really hard to empty her bladder with a little coming out, increase frequency, burning when urinating, with lower back pain that started a week ago.

## 2022-06-09 NOTE — ED Provider Notes (Signed)
[facility]    CSN: [id number] Arrival date & time: 06/09/22  1420      History   Chief Complaint Chief Complaint  Patient presents with   Urinary Frequency    HPI Cheyenne Brown is a 62 y.o. female.   Patient presents today for more than 1 week history of dysuria, urinary frequency that is worse at nighttime, voiding small amounts, foul urinary odor, right low back pain.  She denies urinary urgency, new urinary incontinence, hematuria, abdominal pain, flank pain, fever, body aches, chills, and nausea/vomiting.  No decreased appetite and no vaginal discharge.  Patient reports she has chronic back pain at baseline, however the back pain she has been having is different than her normal back pain.  Patient reports she has a history of a kidney infection that occurred years ago.  No issues with recurrent UTIs.  Patient denies antibiotic use in the past 90 days.    Past Medical History:  Diagnosis Date   Anemia    presumed related to metromenorrhagia   Arthritis    Constipation    GERD (gastroesophageal reflux disease)    Head trauma 1996   depressed skull fracture and epidural hematoma following below with a blunt object; surgery and prolonged rehabilitation required   Headache    migraines   History of tobacco abuse     15 pack years; discontinued 2008   Hypertension    Menometrorrhagia    PUD (peptic ulcer disease)    per pt report, likely secondary to [facility] powders    Patient Active Problem List   Diagnosis Date Noted   Gastrointestinal hemorrhage    Opioid dependence 09/05/2021   Anxiety 09/05/2021   Symptomatic anemia 09/04/2021   Essential hypertension 09/04/2021   Herniated lumbar disc without myelopathy 12/09/2018   Constipation 12/20/2014   Dysphagia 10/24/2010   Special screening for malignant neoplasms, colon 10/24/2010   Chest pain, atypical 10/16/2010   Syncope 05/29/2010   Iron deficiency anemia 02/20/2006   History of tobacco abuse  01/23/2006   GERD 01/23/2006   HEAD TRAUMA, HX OF 01/23/2006    Past Surgical History:  Procedure Laterality Date   BRAIN SURGERY     COLONOSCOPY  10/30/2010   RMR: Anal papilla, otherwise normal rectum and colon (melanosis coli)   COLONOSCOPY WITH PROPOFOL N/A 09/06/2021   Procedure: COLONOSCOPY WITH PROPOFOL;  Surgeon: Eloise Harman, DO;  [facility];  Service: Endoscopy;  Laterality: N/A;   crainiotomy  1996   ESOPHAGOGASTRODUODENOSCOPY  10/30/2010   RMR: Normal tubular esophagus. Staus post of maloney dilation as described above. . Normal stomach, duodenum through the second portion   ESOPHAGOGASTRODUODENOSCOPY (EGD) WITH PROPOFOL N/A 09/06/2021   Procedure: ESOPHAGOGASTRODUODENOSCOPY (EGD) WITH PROPOFOL;  Surgeon: Eloise Harman, DO;  [facility];  Service: Endoscopy;  Laterality: N/A;   LUMBAR LAMINECTOMY/DECOMPRESSION MICRODISCECTOMY Left 12/09/2018   Procedure: Left Lumbar Four-Five Lumbar Five-Sacral One Microdiscectomy;  Surgeon: Erline Levine, MD;  Location: [place];  Service: Neurosurgery;  Laterality: Left;  Left Lumbar Four-Five Lumbar Five-Sacral One Microdiscectomy   MALONEY DILATION  10/30/2010   Procedure: MALONEY DILATION;  Surgeon: Daneil Dolin, MD;  [facility];  Service: Endoscopy;  Laterality: N/A;   NECK SURGERY  2008 & 2011   x2   TRANSFORAMINAL LUMBAR INTERBODY FUSION (TLIF) WITH PEDICLE SCREW FIXATION 2 LEVEL Left 01/12/2020   Procedure: Left Lumbar Four-Five Lumbar Five Sacral One Transforaminal lumbar interbody fusion;  Surgeon: Erline Levine,  MD;  Location: [place];  Service: Neurosurgery;  Laterality: Left;  posterior    OB History   No obstetric history on file.      Home Medications    Prior to Admission medications   Medication Sig Start Date End Date Taking? Authorizing Provider  ALPRAZolam Duanne Moron) 1 MG tablet Take 1 mg by mouth 3 (three) times daily as needed for anxiety.    Yes [provider]   amLODipine (NORVASC) 5 MG tablet Take 5 mg by mouth daily.   Yes [provider]  bisacodyl (DULCOLAX) 5 MG EC tablet Take 10 mg by mouth daily as needed for moderate constipation.   Yes [provider]  cephALEXin (KEFLEX) 500 MG capsule Take 1 capsule (500 mg total) by mouth 2 (two) times daily for 5 days. 06/09/22 06/14/22 Yes Eulogio Bear, NP  cyclobenzaprine (FLEXERIL) 10 MG tablet Take 10 mg by mouth at bedtime. 12/17/20  Yes [provider]  gabapentin (NEURONTIN) 300 MG capsule Take 300 mg by mouth 3 (three) times daily.   Yes [provider]  HYDROcodone-acetaminophen (NORCO) 10-325 MG tablet Take 1 tablet by mouth 2 (two) times daily as needed for moderate pain. 05/28/21  Yes [provider]  lubiprostone (AMITIZA) 24 MCG capsule Take 1 capsule (24 mcg total) by mouth 2 (two) times daily with a meal. 10/16/21  Yes Mahon, Courtney L, NP  omeprazole (PRILOSEC) 20 MG capsule Take 20 mg by mouth daily.   Yes [provider]  phenazopyridine (PYRIDIUM) 100 MG tablet Take 1 tablet (100 mg total) by mouth 3 (three) times daily as needed for pain. 06/09/22  Yes Noemi Chapel A, NP  polyethylene glycol (MIRALAX / GLYCOLAX) 17 g packet Take 17 g by mouth 2 (two) times daily.   Yes [provider]  ferrous sulfate 325 (65 FE) MG tablet Take 1 tablet (325 mg total) by mouth 2 (two) times daily with a meal. 09/07/21   Tat, Shanon Brow, MD    Family History Family History  Problem Relation Age of Onset   Coronary artery disease Mother        CABG   Ulcers Father    Coronary artery disease Brother        CABG-2011   Coronary artery disease Sister        PCI   Colon cancer Neg Hx     Social History Social History   Tobacco Use   Smoking status: Former    Packs/day: 0.50    Years: 25.00    Additional pack years: 0.00    Total pack years: 12.50    Types: Cigarettes    Quit date: 05/29/2007    Years since quitting: 15.0    Smokeless tobacco: Never  Vaping Use   Vaping Use: Never used  Substance Use Topics   Alcohol use: No   Drug use: No     Allergies   Patient has no known allergies.   Review of Systems Review of Systems Per HPI  Physical Exam Triage Vital Signs ED Triage Vitals [06/09/22 1539]  Enc Vitals Group     BP 125/79     Pulse Rate 90     Resp 16     Temp 98 F (36.7 C)     Temp Source Oral     SpO2 98 %     Weight      Height      Head Circumference      Peak Flow  Pain Score 7     Pain Loc      Pain Edu?      Excl. in [place]?    No data found.  Updated Vital Signs BP 125/79 (BP Location: Right Arm)   Pulse 90   Temp 98 F (36.7 C) (Oral)   Resp 16   SpO2 98%   Visual Acuity Right Eye Distance:   Left Eye Distance:   Bilateral Distance:    Right Eye Near:   Left Eye Near:    Bilateral Near:     Physical Exam Vitals and nursing note reviewed.  Constitutional:      General: She is not in acute distress.    Appearance: She is not toxic-appearing.  Pulmonary:     Effort: Pulmonary effort is normal. No respiratory distress.  Abdominal:     General: Abdomen is flat. Bowel sounds are normal. There is no distension.     Palpations: Abdomen is soft. There is no mass.     Tenderness: There is no abdominal tenderness. There is no right CVA tenderness, left CVA tenderness or guarding.  Skin:    General: Skin is warm and dry.     Coloration: Skin is not jaundiced or pale.     Findings: No erythema.  Neurological:     Mental Status: She is alert and oriented to person, place, and time.     Motor: No weakness.     Gait: Gait normal.  Psychiatric:        Behavior: Behavior is cooperative.      UC Treatments / Results  Labs (all labs ordered are listed, but only abnormal results are displayed) Labs Reviewed  POCT URINALYSIS DIP (MANUAL ENTRY) - Abnormal; Notable for the following components:      Result Value   Color, UA light yellow (*)    Spec Grav, UA  <=1.005 (*)    All other components within normal limits  URINE CULTURE    EKG   Radiology No results found.  Procedures Procedures (including critical care time)  Medications Ordered in UC Medications - No data to display  Initial Impression / Assessment and Plan / UC Course  I have reviewed the triage vital signs and the nursing notes.  Pertinent labs & imaging results that were available during my care of the patient were reviewed by me and considered in my medical decision making (see chart for details).   Patient is well-appearing, normotensive, afebrile, not tachycardic, not tachypneic, oxygenating well on room air.    1. Dysuria Urinalysis today is dilute Urine culture is pending Will treat [place] based on symptoms with Keflex twice daily for 5 days Also start Pyridium as needed every 8 hours for bladder pain Strict ER precautions discussed with patient   The patient was given the opportunity to ask questions.  All questions answered to their satisfaction.  The patient is in agreement to this plan.    Final Clinical Impressions(s) / UC Diagnoses   Final diagnoses:  Dysuria     Discharge Instructions      It sounds like you have a UTI.  Please take the Keflex as prescribed to treat it.  Continue drinking plenty of water. You can take the pyridium as needed for bladder pain.    We will call you later this week if the urine culture shows we need to treat with a different antibiotic.      ED Prescriptions     Medication Sig Dispense Auth. Provider  cephALEXin (KEFLEX) 500 MG capsule Take 1 capsule (500 mg total) by mouth 2 (two) times daily for 5 days. 10 capsule Noemi Chapel A, NP   phenazopyridine (PYRIDIUM) 100 MG tablet Take 1 tablet (100 mg total) by mouth 3 (three) times daily as needed for pain. 10 tablet Eulogio Bear, NP      PDMP not reviewed this encounter.   Eulogio Bear, NP 06/09/22 [phone]

## 2022-06-10 LAB — URINE CULTURE: Culture: <10000 — AB

## 2022-06-27 ENCOUNTER — Other Ambulatory Visit (HOSPITAL_COMMUNITY): Payer: Self-pay | Admitting: Family Medicine

## 2022-06-27 DIAGNOSIS — Z1231 Encounter for screening mammogram for malignant neoplasm of breast: Secondary | ICD-10-CM

## 2022-07-14 ENCOUNTER — Ambulatory Visit (HOSPITAL_COMMUNITY)
Admission: RE | Admit: 2022-07-14 | Discharge: 2022-07-14 | Disposition: A | Discharge status: Home / Self Care | Payer: PPO | Source: Ambulatory Visit | Attending: Family Medicine | Admitting: Family Medicine

## 2022-07-14 DIAGNOSIS — Z1231 Encounter for screening mammogram for malignant neoplasm of breast: Secondary | ICD-10-CM | POA: Diagnosis not present

## 2022-07-16 ENCOUNTER — Ambulatory Visit: Payer: PPO | Admitting: Cardiology

## 2022-08-11 ENCOUNTER — Other Ambulatory Visit (HOSPITAL_COMMUNITY): Payer: Self-pay | Admitting: Surgery

## 2022-08-11 DIAGNOSIS — M542 Cervicalgia: Secondary | ICD-10-CM

## 2022-09-09 ENCOUNTER — Ambulatory Visit: Payer: PPO | Attending: Cardiology | Admitting: Internal Medicine

## 2022-09-09 VITALS — BP 122/82 | HR 86 | Ht 65.5 in | Wt 116.2 lb

## 2022-09-09 DIAGNOSIS — I10 Essential (primary) hypertension: Secondary | ICD-10-CM | POA: Diagnosis not present

## 2022-09-09 DIAGNOSIS — I351 Nonrheumatic aortic (valve) insufficiency: Secondary | ICD-10-CM

## 2022-09-09 NOTE — Patient Instructions (Signed)
Medication Instructions:  Your physician recommends that you continue on your current medications as directed. Please refer to the Current Medication list given to you today.   Labwork: None  Testing/Procedures: Your physician has requested that you have an echocardiogram. Echocardiography is a painless test that uses sound waves to create images of your heart. It provides your doctor with information about the size and shape of your heart and how well your heart's chambers and valves are working. This procedure takes approximately one hour. There are no restrictions for this procedure. Please do NOT wear cologne, perfume, aftershave, or lotions (deodorant is allowed). Please arrive 15 minutes prior to your appointment time.   Follow-Up: Your physician recommends that you schedule a follow-up appointment in: Pending Results  Any Other Special Instructions Will Be Listed Below (If Applicable).  If you need a refill on your cardiac medications before your next appointment, please call your pharmacy.  
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
## 2022-09-24 ENCOUNTER — Ambulatory Visit (HOSPITAL_COMMUNITY)
Admission: RE | Admit: 2022-09-24 | Discharge: 2022-09-24 | Disposition: A | Discharge status: Home / Self Care | Payer: PPO | Source: Ambulatory Visit | Attending: Surgery | Admitting: Surgery

## 2022-09-24 DIAGNOSIS — M542 Cervicalgia: Secondary | ICD-10-CM

## 2022-10-21 ENCOUNTER — Ambulatory Visit (HOSPITAL_COMMUNITY): Admission: RE | Admit: 2022-10-21 | Payer: PPO | Source: Ambulatory Visit

## 2022-12-16 ENCOUNTER — Ambulatory Visit (HOSPITAL_COMMUNITY)
Admission: RE | Admit: 2022-12-16 | Discharge: 2022-12-16 | Disposition: A | Discharge status: Home / Self Care | Payer: PPO | Source: Ambulatory Visit | Attending: Cardiology | Admitting: Cardiology

## 2022-12-16 DIAGNOSIS — I351 Nonrheumatic aortic (valve) insufficiency: Secondary | ICD-10-CM

## 2022-12-16 NOTE — Progress Notes (Signed)
*  PRELIMINARY RESULTS* Echocardiogram 2D Echocardiogram has been performed.  Stacey Drain 12/16/2022, 3:01 PM

## 2022-12-17 LAB — ECHOCARDIOGRAM COMPLETE
Area-P 1/2: 2.29 cm2
P 1/2 time: 515 ms
S' Lateral: 2.9 cm

## 2022-12-29 ENCOUNTER — Ambulatory Visit (INDEPENDENT_AMBULATORY_CARE_PROVIDER_SITE_OTHER): Payer: PPO | Admitting: Adult Health

## 2022-12-29 ENCOUNTER — Other Ambulatory Visit (HOSPITAL_COMMUNITY)
Admission: RE | Admit: 2022-12-29 | Discharge: 2022-12-29 | Disposition: A | Discharge status: Home / Self Care | Payer: PPO | Source: Ambulatory Visit | Attending: Adult Health | Admitting: Adult Health

## 2022-12-29 ENCOUNTER — Encounter: Payer: Self-pay | Admitting: Adult Health

## 2022-12-29 VITALS — BP 124/86 | HR 75 | Ht 65.5 in | Wt 117.5 lb

## 2022-12-29 DIAGNOSIS — Z1331 Encounter for screening for depression: Secondary | ICD-10-CM

## 2022-12-29 DIAGNOSIS — Z1211 Encounter for screening for malignant neoplasm of colon: Secondary | ICD-10-CM | POA: Diagnosis not present

## 2022-12-29 DIAGNOSIS — Z1151 Encounter for screening for human papillomavirus (HPV): Secondary | ICD-10-CM | POA: Insufficient documentation

## 2022-12-29 DIAGNOSIS — Z01419 Encounter for gynecological examination (general) (routine) without abnormal findings: Secondary | ICD-10-CM | POA: Diagnosis present

## 2022-12-29 LAB — HEMOCCULT GUIAC POC 1CARD (OFFICE): Fecal Occult Blood, POC: NEGATIVE
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
## 2023-01-05 LAB — CYTOLOGY - PAP
Comment: NEGATIVE
Diagnosis: NEGATIVE
High risk HPV: NEGATIVE

## 2023-07-21 ENCOUNTER — Other Ambulatory Visit (HOSPITAL_COMMUNITY): Payer: Self-pay | Admitting: Family Medicine

## 2023-07-21 DIAGNOSIS — Z1231 Encounter for screening mammogram for malignant neoplasm of breast: Secondary | ICD-10-CM

## 2023-07-31 ENCOUNTER — Ambulatory Visit (HOSPITAL_COMMUNITY)

## 2023-08-07 ENCOUNTER — Ambulatory Visit (HOSPITAL_COMMUNITY)
Admission: RE | Admit: 2023-08-07 | Discharge: 2023-08-07 | Disposition: A | Discharge status: Home / Self Care | Source: Ambulatory Visit | Attending: Family Medicine | Admitting: Family Medicine

## 2023-08-07 DIAGNOSIS — Z1231 Encounter for screening mammogram for malignant neoplasm of breast: Secondary | ICD-10-CM | POA: Insufficient documentation

## 2023-10-23 ENCOUNTER — Encounter (INDEPENDENT_AMBULATORY_CARE_PROVIDER_SITE_OTHER): Payer: Self-pay

## 2024-01-01 ENCOUNTER — Encounter (INDEPENDENT_AMBULATORY_CARE_PROVIDER_SITE_OTHER): Payer: Self-pay

## 2024-01-27 ENCOUNTER — Other Ambulatory Visit (HOSPITAL_BASED_OUTPATIENT_CLINIC_OR_DEPARTMENT_OTHER): Payer: Self-pay | Admitting: Family Medicine

## 2024-01-27 DIAGNOSIS — Z780 Asymptomatic menopausal state: Secondary | ICD-10-CM
